# Patient Record
Sex: Female | Born: 1975 | Race: White | Hispanic: No | Marital: Married | Smoking: Current every day smoker
Health system: Southern US, Community
[De-identification: ages and names within clinical notes are randomized; demographics above are authoritative.]

## PROBLEM LIST (undated history)

## (undated) DIAGNOSIS — B1920 Unspecified viral hepatitis C without hepatic coma: Secondary | ICD-10-CM

## (undated) HISTORY — PX: APPENDECTOMY: SHX54

---

## 2014-12-27 ENCOUNTER — Encounter (HOSPITAL_COMMUNITY): Payer: Self-pay

## 2014-12-27 ENCOUNTER — Emergency Department (HOSPITAL_COMMUNITY)
Admission: EM | Admit: 2014-12-27 | Discharge: 2014-12-27 | Disposition: A | Discharge status: Home / Self Care | Payer: BLUE CROSS/BLUE SHIELD | Attending: Emergency Medicine | Admitting: Emergency Medicine

## 2014-12-27 DIAGNOSIS — R109 Unspecified abdominal pain: Secondary | ICD-10-CM | POA: Diagnosis present

## 2014-12-27 DIAGNOSIS — N390 Urinary tract infection, site not specified: Secondary | ICD-10-CM | POA: Insufficient documentation

## 2014-12-27 DIAGNOSIS — Z3202 Encounter for pregnancy test, result negative: Secondary | ICD-10-CM | POA: Insufficient documentation

## 2014-12-27 DIAGNOSIS — Z720 Tobacco use: Secondary | ICD-10-CM | POA: Insufficient documentation

## 2014-12-27 DIAGNOSIS — E669 Obesity, unspecified: Secondary | ICD-10-CM | POA: Diagnosis not present

## 2014-12-27 LAB — COMPREHENSIVE METABOLIC PANEL
ALBUMIN: 3.2 g/dL — AB (ref 3.5–5.2)
ALK PHOS: 67 U/L (ref 39–117)
ALT: 10 U/L (ref 0–35)
AST: 11 U/L (ref 0–37)
Anion gap: 9 (ref 5–15)
BUN: 9 mg/dL (ref 6–23)
CALCIUM: 9 mg/dL (ref 8.4–10.5)
CO2: 23 mmol/L (ref 19–32)
Chloride: 107 mmol/L (ref 96–112)
Creatinine, Ser: 0.76 mg/dL (ref 0.50–1.10)
GFR calc Af Amer: >90 mL/min (ref >90)
GFR calc non Af Amer: >90 mL/min (ref >90)
Glucose, Bld: 98 mg/dL (ref 70–99)
Potassium: 3.9 mmol/L (ref 3.5–5.1)
Sodium: 139 mmol/L (ref 135–145)
TOTAL PROTEIN: 6.6 g/dL (ref 6.0–8.3)
Total Bilirubin: 0.2 mg/dL — ABNORMAL LOW (ref 0.3–1.2)

## 2014-12-27 LAB — URINALYSIS, ROUTINE W REFLEX MICROSCOPIC
BILIRUBIN URINE: NEGATIVE
GLUCOSE, UA: NEGATIVE mg/dL
KETONES UR: NEGATIVE mg/dL
Nitrite: POSITIVE — AB
Protein, ur: NEGATIVE mg/dL
Specific Gravity, Urine: 1.016 (ref 1.005–1.030)
Urobilinogen, UA: 0.2 mg/dL (ref 0.0–1.0)
pH: 6.5 (ref 5.0–8.0)

## 2014-12-27 LAB — CBC WITH DIFFERENTIAL/PLATELET
Basophils Absolute: 0 10*3/uL (ref 0.0–0.1)
Basophils Relative: 0 % (ref 0–1)
EOS ABS: 0.2 10*3/uL (ref 0.0–0.7)
Eosinophils Relative: 2 % (ref 0–5)
HCT: 42.6 % (ref 36.0–46.0)
HEMOGLOBIN: 14.3 g/dL (ref 12.0–15.0)
Lymphocytes Relative: 20 % (ref 12–46)
Lymphs Abs: 2.2 10*3/uL (ref 0.7–4.0)
MCH: 29.9 pg (ref 26.0–34.0)
MCHC: 33.6 g/dL (ref 30.0–36.0)
MCV: 88.9 fL (ref 78.0–100.0)
MONOS PCT: 7 % (ref 3–12)
Monocytes Absolute: 0.7 10*3/uL (ref 0.1–1.0)
NEUTROS PCT: 71 % (ref 43–77)
Neutro Abs: 7.6 10*3/uL (ref 1.7–7.7)
Platelets: 284 10*3/uL (ref 150–400)
RBC: 4.79 MIL/uL (ref 3.87–5.11)
RDW: 13.9 % (ref 11.5–15.5)
WBC: 10.8 10*3/uL — ABNORMAL HIGH (ref 4.0–10.5)

## 2014-12-27 LAB — URINE MICROSCOPIC-ADD ON

## 2014-12-27 LAB — PREGNANCY, URINE: Preg Test, Ur: NEGATIVE

## 2014-12-27 MED ORDER — ONDANSETRON HCL 4 MG/2ML IJ SOLN: 4.0000 mg | Freq: Once | INTRAMUSCULAR | Status: DC

## 2014-12-27 MED ORDER — DEXTROSE 5 % IV SOLN: 1.0000 g | Freq: Once | INTRAVENOUS | Status: DC

## 2014-12-27 MED ORDER — SULFAMETHOXAZOLE-TRIMETHOPRIM 800-160 MG PO TABS: 1.0000 | ORAL_TABLET | Freq: Two times a day (BID) | ORAL | Status: AC

## 2014-12-27 MED ORDER — SODIUM CHLORIDE 0.9 % IV BOLUS (SEPSIS): 1000.0000 mL | Freq: Once | INTRAVENOUS | Status: DC

## 2014-12-27 MED ORDER — TRAMADOL HCL 50 MG PO TABS: 50.0000 mg | ORAL_TABLET | Freq: Four times a day (QID) | ORAL | Status: DC | PRN

## 2014-12-27 MED ORDER — MORPHINE SULFATE 4 MG/ML IJ SOLN: 4.0000 mg | Freq: Once | INTRAMUSCULAR | Status: DC

## 2014-12-27 NOTE — Discharge Instructions (Signed)

## 2014-12-27 NOTE — ED Notes (Signed)
Pt stable, ambulatory, states understanding of discharge instructions 

## 2014-12-27 NOTE — ED Provider Notes (Signed)
CSN: [id number]     Arrival date & time 12/27/14  1234 History   First MD Initiated Contact with Patient 12/27/14 1613     Chief Complaint  Patient presents with  . Abdominal Pain  . Back Pain     (Consider location/radiation/quality/duration/timing/severity/associated sxs/prior Treatment) HPI   39 year old female who presents for evaluation of abdominal pain. Patient reports for the past 4 days she has been expressing pain towards the bladder and also pain to her low back. She described pain as a dull and achy sensation with occasional sharp pain. Endorse fever, MAXIMUM TEMPERATURE 101.3 at home improved with taking ibuprofen. She is been feeling nauseous without vomiting. She does have decrease in appetite. Pain is keeping her up at night. She reported having prior history of UTI but normally does not experiencing dysuria such as urinary frequency, urgency, or burning urination. She denies having any severe headache, chest pain, shortness of breath, productive cough, hematuria, vaginal bleeding, vaginal discharge, or rash. Denies any pain with sexual activities. No prior history of STD. She does not have a primary care provider.  History reviewed. No pertinent past medical history. Past Surgical History  Procedure Laterality Date  . Cesarean section  x2   No family history on file. History  Substance Use Topics  . Smoking status: Current Every Day Smoker    Types: Cigarettes  . Smokeless tobacco: Not on file  . Alcohol Use: Yes     Comment: social   OB History    No data available     Review of Systems  All other systems reviewed and are negative.     Allergies  Review of patient's allergies indicates no known allergies.  Home Medications   Prior to Admission medications   Not on File   BP 160/99 mmHg  Pulse 82  Temp(Src) 98.3 F (36.8 C) (Oral)  Resp 17  SpO2 100% Physical Exam  Constitutional: She appears well-developed and well-nourished. No distress.   Obese Caucasian female. Nontoxic in appearance  HENT:  Head: Atraumatic.  Eyes: Conjunctivae are normal.  Neck: Neck supple.  Cardiovascular: Normal rate and regular rhythm.   Pulmonary/Chest: Effort normal and breath sounds normal. No respiratory distress.  Abdominal: Soft. Bowel sounds are normal. She exhibits no distension. There is tenderness (Mild superpubic tenderness to palpation without guarding or rebound tenderness. No hernia noted. No overlying skin changes noted.).  Genitourinary:  Tenderness to left mid to low back on percussion. No CVA tenderness.  Neurological: She is alert.  Skin: No rash noted.  Psychiatric: She has a normal mood and affect.  Nursing note and vitals reviewed.   ED Course  Procedures (including critical care time)  4:32 PM Patient here with suprapubic abdominal pain back pain with recent fever. She is currently afebrile with stable vital sign. Her urinalysis is consistent with a urinary tract infection. Mild leukocytosis without left shift. Electrolytes otherwise unremarkable. Will give a dose of Rocephin here but I anticipate patient can be discharged with antibiotic and a short course of pain medication as requested.  4:49 PM Patient prefers by mouth antibiotic and stable for discharge. She is able to tolerate by mouth. Return precautions discussed.   Labs Review Labs Reviewed  CBC WITH DIFFERENTIAL/PLATELET - Abnormal; Notable for the following:    WBC 10.8 (*)    All other components within normal limits  COMPREHENSIVE METABOLIC PANEL - Abnormal; Notable for the following:    Albumin 3.2 (*)    Total Bilirubin 0.2 (*)  All other components within normal limits  URINALYSIS, ROUTINE W REFLEX MICROSCOPIC - Abnormal; Notable for the following:    APPearance CLOUDY (*)    Hgb urine dipstick SMALL (*)    Nitrite POSITIVE (*)    Leukocytes, UA LARGE (*)    All other components within normal limits  URINE MICROSCOPIC-ADD ON - Abnormal; Notable  for the following:    Squamous Epithelial / LPF MANY (*)    Bacteria, UA MANY (*)    All other components within normal limits  PREGNANCY, URINE    Imaging Review No results found.   EKG Interpretation None      MDM   Final diagnoses:  UTI (lower urinary tract infection)    BP 162/94 mmHg  Pulse 80  Temp(Src) 98.5 F (36.9 C) (Oral)  Resp 18  Ht  (1.549 m)  Wt 152 lb (68.947 kg)  BMI 28.74 kg/m2  SpO2 96%     Fayrene Helper, PA-C 12/27/14 1649  Benjiman Core, MD 12/30/14 [phone]

## 2014-12-27 NOTE — ED Notes (Signed)
Pt having lower back pain and lower mid abd pain since Sunday off and on with fever and nausea but no vomiting. No vaginal discharge or bleeding.

## 2015-07-21 ENCOUNTER — Encounter (HOSPITAL_COMMUNITY): Payer: Self-pay | Admitting: *Deleted

## 2015-07-21 ENCOUNTER — Emergency Department (HOSPITAL_COMMUNITY): Payer: BLUE CROSS/BLUE SHIELD

## 2015-07-21 ENCOUNTER — Emergency Department (HOSPITAL_COMMUNITY)
Admission: EM | Admit: 2015-07-21 | Discharge: 2015-07-21 | Disposition: A | Discharge status: Home / Self Care | Payer: BLUE CROSS/BLUE SHIELD | Attending: Emergency Medicine | Admitting: Emergency Medicine

## 2015-07-21 DIAGNOSIS — Z3202 Encounter for pregnancy test, result negative: Secondary | ICD-10-CM | POA: Diagnosis not present

## 2015-07-21 DIAGNOSIS — Z79899 Other long term (current) drug therapy: Secondary | ICD-10-CM | POA: Insufficient documentation

## 2015-07-21 DIAGNOSIS — R1011 Right upper quadrant pain: Secondary | ICD-10-CM | POA: Insufficient documentation

## 2015-07-21 DIAGNOSIS — F17210 Nicotine dependence, cigarettes, uncomplicated: Secondary | ICD-10-CM | POA: Insufficient documentation

## 2015-07-21 LAB — COMPREHENSIVE METABOLIC PANEL
ALBUMIN: 4 g/dL (ref 3.5–5.0)
ALT: 18 U/L (ref 14–54)
AST: 20 U/L (ref 15–41)
Alkaline Phosphatase: 64 U/L (ref 38–126)
Anion gap: 9 (ref 5–15)
BUN: 16 mg/dL (ref 6–20)
CHLORIDE: 101 mmol/L (ref 101–111)
CO2: 23 mmol/L (ref 22–32)
Calcium: 10.2 mg/dL (ref 8.9–10.3)
Creatinine, Ser: 0.89 mg/dL (ref 0.44–1.00)
GFR calc Af Amer: >60 mL/min (ref >60)
Glucose, Bld: 98 mg/dL (ref 65–99)
POTASSIUM: 4.3 mmol/L (ref 3.5–5.1)
SODIUM: 133 mmol/L — AB (ref 135–145)
Total Bilirubin: 0.6 mg/dL (ref 0.3–1.2)
Total Protein: 7.2 g/dL (ref 6.5–8.1)

## 2015-07-21 LAB — CBC WITH DIFFERENTIAL/PLATELET
BASOS ABS: 0 10*3/uL (ref 0.0–0.1)
BASOS PCT: 0 %
EOS PCT: 2 %
Eosinophils Absolute: 0.2 10*3/uL (ref 0.0–0.7)
HCT: 46.1 % — ABNORMAL HIGH (ref 36.0–46.0)
Hemoglobin: 15.9 g/dL — ABNORMAL HIGH (ref 12.0–15.0)
Lymphocytes Relative: 24 %
Lymphs Abs: 3 10*3/uL (ref 0.7–4.0)
MCH: 31.5 pg (ref 26.0–34.0)
MCHC: 34.5 g/dL (ref 30.0–36.0)
MCV: 91.5 fL (ref 78.0–100.0)
Monocytes Absolute: 0.6 10*3/uL (ref 0.1–1.0)
Monocytes Relative: 5 %
Neutro Abs: 8.7 10*3/uL — ABNORMAL HIGH (ref 1.7–7.7)
Neutrophils Relative %: 69 %
PLATELETS: 250 10*3/uL (ref 150–400)
RBC: 5.04 MIL/uL (ref 3.87–5.11)
RDW: 13 % (ref 11.5–15.5)
WBC: 12.6 10*3/uL — AB (ref 4.0–10.5)

## 2015-07-21 LAB — URINALYSIS, ROUTINE W REFLEX MICROSCOPIC
Bilirubin Urine: NEGATIVE
GLUCOSE, UA: NEGATIVE mg/dL
Hgb urine dipstick: NEGATIVE
Ketones, ur: NEGATIVE mg/dL
Nitrite: NEGATIVE
PROTEIN: NEGATIVE mg/dL
Specific Gravity, Urine: 1.015 (ref 1.005–1.030)
pH: 5 (ref 5.0–8.0)

## 2015-07-21 LAB — LIPASE, BLOOD: LIPASE: 23 U/L (ref 11–51)

## 2015-07-21 LAB — URINE MICROSCOPIC-ADD ON: Bacteria, UA: NONE SEEN

## 2015-07-21 LAB — PREGNANCY, URINE: PREG TEST UR: NEGATIVE

## 2015-07-21 MED ORDER — METRONIDAZOLE 500 MG PO TABS
2000.0000 mg | ORAL_TABLET | Freq: Once | ORAL | Status: AC
Start: 2015-07-21 — End: 2015-07-21
  Administered 2015-07-21: 2000 mg via ORAL
  Filled 2015-07-21: qty 4

## 2015-07-21 MED ORDER — FENTANYL CITRATE (PF) 100 MCG/2ML IJ SOLN
100.0000 ug | Freq: Once | INTRAMUSCULAR | Status: AC
  Administered 2015-07-21: 100 ug via INTRAVENOUS
  Filled 2015-07-21: qty 2

## 2015-07-21 MED ORDER — METRONIDAZOLE 500 MG PO TABS: 2000.0000 mg | ORAL_TABLET | Freq: Once | ORAL | Status: DC

## 2015-07-21 MED ORDER — ONDANSETRON HCL 4 MG PO TABS: 4.0000 mg | ORAL_TABLET | Freq: Four times a day (QID) | ORAL | Status: DC

## 2015-07-21 MED ORDER — OXYCODONE-ACETAMINOPHEN 5-325 MG PO TABS: 2.0000 | ORAL_TABLET | ORAL | Status: DC | PRN

## 2015-07-21 NOTE — Discharge Instructions (Signed)
Ms. Holly [name],  Nice meeting you! Your ultrasound was negative. Please follow-up with your primary care provider within one week. Return to the emergency department if you develop fevers, cannot keep food down, or do not improve within a few days. Feel better soon!  S. Lane HackerNicole Mariama Saintvil, PA-C  Abdominal Pain, Adult Many things can cause abdominal pain. Usually, abdominal pain is not caused by a disease and will improve without treatment. It can often be observed and treated at home. Your health care provider will do a physical exam and possibly order blood tests and X-rays to help determine the seriousness of your pain. However, in many cases, more time must pass before a clear cause of the pain can be found. Before that point, your health care provider may not know if you need more testing or further treatment. HOME CARE INSTRUCTIONS Monitor your abdominal pain for any changes. The following actions may help to alleviate any discomfort you are experiencing:  Only take over-the-counter or prescription medicines as directed by your health care provider.  Do not take laxatives unless directed to do so by your health care provider.  Try a clear liquid diet (broth, tea, or water) as directed by your health care provider. Slowly move to a bland diet as tolerated. SEEK MEDICAL CARE IF:  You have unexplained abdominal pain.  You have abdominal pain associated with nausea or diarrhea.  You have pain when you urinate or have a bowel movement.  You experience abdominal pain that wakes you in the night.  You have abdominal pain that is worsened or improved by eating food.  You have abdominal pain that is worsened with eating fatty foods.  You have a fever. SEEK IMMEDIATE MEDICAL CARE IF:  Your pain does not go away within 2 hours.  You keep throwing up (vomiting).  Your pain is felt only in portions of the abdomen, such as the right side or the left lower portion of the abdomen.  You pass  bloody or black tarry stools. MAKE SURE YOU:  Understand these instructions.  Will watch your condition.  Will get help right away if you are not doing well or get worse.   This information is not intended to replace advice given to you by your health care provider. Make sure you discuss any questions you have with your health care provider.   Document Released: 05/28/2005 Document Revised: 05/09/2015 Document Reviewed: 04/27/2013 Elsevier Interactive Patient Education [employer]2016 Elsevier Inc.

## 2015-07-21 NOTE — ED Notes (Signed)
Pt reports abd pain, n/v/d. Now has dizziness. No acute distress noted.

## 2015-07-21 NOTE — ED Provider Notes (Signed)
CSN: [id number]     Arrival date & time 07/21/15  1105 History   First MD Initiated Contact with Patient 07/21/15 1116     Chief Complaint  Patient presents with  . Abdominal Pain    HPI   Holly [name] is a 39 y.o. F presenting with a 2 day history of abdominal pain, nausea, vomiting, diarrhea, and one episode of dizziness. She describes her pain as RUQ in location, non-radiating, intermittent, a dull ache. She denies fevers, chills, CP, cough, recent abx use, raw/undercooked foods, ill contacts, alleviation attempts or exacerbating factors, hematochezia, association with eating or bowel movements, vaginal odor/itching/discharge.   History reviewed. No pertinent past medical history. Past Surgical History  Procedure Laterality Date  . Cesarean section  x2   History reviewed. No pertinent family history. Social History  Substance Use Topics  . Smoking status: Current Every Day Smoker    Types: Cigarettes  . Smokeless tobacco: None  . Alcohol Use: Yes     Comment: social   OB History    No data available     Review of Systems  Ten systems are reviewed and are negative for acute change except as noted in the HPI   Allergies  Review of patient's allergies indicates no known allergies.  Home Medications   Prior to Admission medications   Medication Sig Start Date End Date Taking? Authorizing Provider  ibuprofen (ADVIL,MOTRIN) 200 MG tablet Take 800 mg by mouth every 6 (six) hours as needed (pain).   Yes Historical Provider, MD  metroNIDAZOLE (FLAGYL) 500 MG tablet Take 4 tablets (2,000 mg total) by mouth once. 07/21/15   Melton Krebs, PA-C  ondansetron (ZOFRAN) 4 MG tablet Take 1 tablet (4 mg total) by mouth every 6 (six) hours. 07/21/15   Melton Krebs, PA-C  oxyCODONE-acetaminophen (PERCOCET/ROXICET) 5-325 MG tablet Take 2 tablets by mouth every 4 (four) hours as needed for severe pain. 07/21/15   Melton Krebs, PA-C   BP 113/82 mmHg  Pulse  82  Temp(Src) 97.8 F (36.6 C) (Oral)  Resp 22  Ht  (1.549 m)  Wt 81.466 kg  BMI 33.95 kg/m2  SpO2 97%  LMP 07/07/2015 Physical Exam  Constitutional: She appears well-developed and well-nourished. No distress.  HENT:  Head: Normocephalic and atraumatic.  Mouth/Throat: Oropharynx is clear and moist. No oropharyngeal exudate.  Eyes: Conjunctivae are normal. Pupils are equal, round, and reactive to light. Right eye exhibits no discharge. Left eye exhibits no discharge. No scleral icterus.  Neck: No tracheal deviation present.  Cardiovascular: Normal rate, regular rhythm, normal heart sounds and intact distal pulses.  Exam reveals no gallop and no friction rub.   No murmur heard. Pulmonary/Chest: Effort normal and breath sounds normal. No respiratory distress. She has no wheezes. She has no rales. She exhibits no tenderness.  Abdominal: Soft. Bowel sounds are normal. She exhibits no distension and no mass. There is tenderness. There is no rebound and no guarding.  RUQ tenderness. Negative Murphy's sign  Musculoskeletal: She exhibits no edema.  Lymphadenopathy:    She has no cervical adenopathy.  Neurological: She is alert. Coordination normal.  Skin: Skin is warm and dry. No rash noted. She is not diaphoretic. No erythema.  Psychiatric: She has a normal mood and affect. Her behavior is normal.  Nursing note and vitals reviewed.   ED Course  Procedures (including critical care time) Labs Review Labs Reviewed  CBC WITH DIFFERENTIAL/PLATELET - Abnormal; Notable for the following:  WBC 12.6 (*)    Hemoglobin 15.9 (*)    HCT 46.1 (*)    Neutro Abs 8.7 (*)    All other components within normal limits  COMPREHENSIVE METABOLIC PANEL - Abnormal; Notable for the following:    Sodium 133 (*)    All other components within normal limits  URINALYSIS, ROUTINE W REFLEX MICROSCOPIC (NOT AT [facility]) - Abnormal; Notable for the following:    Leukocytes, UA SMALL (*)    All other components  within normal limits  URINE MICROSCOPIC-ADD ON - Abnormal; Notable for the following:    Squamous Epithelial / LPF 0-5 (*)    All other components within normal limits  LIPASE, BLOOD  PREGNANCY, URINE    Imaging Review [place] Abdomen Limited  07/21/2015  CLINICAL DATA:  Patient with right upper quadrant pain. EXAM: US ABDOMEN LIMITED - RIGHT UPPER QUADRANT COMPARISON:  CT abdomen pelvis 04/04/2014 FINDINGS: Gallbladder: No gallstones or wall thickening visualized. No sonographic Murphy sign noted. Common bile duct: Diameter: 4 mm Liver: No focal lesion identified. Within normal limits in parenchymal echogenicity. IMPRESSION: No cholelithiasis or sonographic evidence for acute cholecystitis per Electronically Signed   By: Annia Beltrew  Davis M.D.   On: 07/21/2015 13:53   I have personally reviewed and evaluated these images and lab results as part of my medical decision-making.   EKG Interpretation   Date/Time:  Saturday July 21 2015 11:38:01 EST Ventricular Rate:  82 PR Interval:  138 QRS Duration: 101 QT Interval:  369 QTC Calculation: 431 R Axis:   79 Text Interpretation:  Sinus rhythm Abnormal inferior Q waves Abnormal ekg  Confirmed by BEATON  MD, ROBERT ([id number]) on 07/21/2015 11:47:42 AM      MDM   Final diagnoses:  Right upper quadrant pain   Patient non-toxic appearing and VSS. Cholelithiasis vs gastroenteritis most likely. Acute cholecystitis, cholangitis, acute hepatitis, perforated duodenal ulcer, RLL pneumonia less likely based on patient history and physical exam.  UA demonstrates trichomonas. Non-specific leukocytosis at 12.6. Labs and ultrasound otherwise unremarkable for acute changes suggestive of symptom etiology.   On re-evaluation, patient is feeling better. She is able to tolerate PO.  Discussed results of workup with patient (and partner) and advised against sexual contact until trichomonas is treated by both patient and her partner. Patient may be safely  discharged home. Discussed reasons for return. Patient to follow-up with primary care provider within one week. Patient in understanding and agreement with the plan.   Melton KrebsSamantha Nicole Ahren Pettinger, PA-C 08/02/15 1947  Nelva Nayobert Beaton, MD 08/06/15 [phone]

## 2016-01-03 ENCOUNTER — Emergency Department (HOSPITAL_COMMUNITY): Payer: BLUE CROSS/BLUE SHIELD

## 2016-01-03 ENCOUNTER — Encounter (HOSPITAL_COMMUNITY): Payer: Self-pay | Admitting: *Deleted

## 2016-01-03 ENCOUNTER — Emergency Department (HOSPITAL_COMMUNITY)
Admission: EM | Admit: 2016-01-03 | Discharge: 2016-01-03 | Disposition: A | Discharge status: Home / Self Care | Payer: BLUE CROSS/BLUE SHIELD | Attending: Emergency Medicine | Admitting: Emergency Medicine

## 2016-01-03 DIAGNOSIS — R42 Dizziness and giddiness: Secondary | ICD-10-CM | POA: Diagnosis not present

## 2016-01-03 DIAGNOSIS — N390 Urinary tract infection, site not specified: Secondary | ICD-10-CM | POA: Insufficient documentation

## 2016-01-03 DIAGNOSIS — F17210 Nicotine dependence, cigarettes, uncomplicated: Secondary | ICD-10-CM | POA: Insufficient documentation

## 2016-01-03 DIAGNOSIS — R002 Palpitations: Secondary | ICD-10-CM | POA: Diagnosis not present

## 2016-01-03 DIAGNOSIS — Z3202 Encounter for pregnancy test, result negative: Secondary | ICD-10-CM | POA: Insufficient documentation

## 2016-01-03 DIAGNOSIS — R079 Chest pain, unspecified: Secondary | ICD-10-CM | POA: Diagnosis not present

## 2016-01-03 LAB — CBC
HCT: 43.7 % (ref 36.0–46.0)
Hemoglobin: 15.3 g/dL — ABNORMAL HIGH (ref 12.0–15.0)
MCH: 30.4 pg (ref 26.0–34.0)
MCHC: 35 g/dL (ref 30.0–36.0)
MCV: 86.7 fL (ref 78.0–100.0)
PLATELETS: 261 10*3/uL (ref 150–400)
RBC: 5.04 MIL/uL (ref 3.87–5.11)
RDW: 13.4 % (ref 11.5–15.5)
WBC: 14 10*3/uL — AB (ref 4.0–10.5)

## 2016-01-03 LAB — BASIC METABOLIC PANEL
Anion gap: 15 (ref 5–15)
CALCIUM: 9.6 mg/dL (ref 8.9–10.3)
CO2: 17 mmol/L — ABNORMAL LOW (ref 22–32)
CREATININE: 0.8 mg/dL (ref 0.44–1.00)
Chloride: 104 mmol/L (ref 101–111)
GFR calc Af Amer: >60 mL/min (ref >60)
Glucose, Bld: 121 mg/dL — ABNORMAL HIGH (ref 65–99)
Potassium: 3.3 mmol/L — ABNORMAL LOW (ref 3.5–5.1)
SODIUM: 136 mmol/L (ref 135–145)

## 2016-01-03 LAB — URINALYSIS, ROUTINE W REFLEX MICROSCOPIC
BILIRUBIN URINE: NEGATIVE
GLUCOSE, UA: NEGATIVE mg/dL
Ketones, ur: NEGATIVE mg/dL
Nitrite: POSITIVE — AB
PROTEIN: NEGATIVE mg/dL
SPECIFIC GRAVITY, URINE: 1.007 (ref 1.005–1.030)
pH: 7 (ref 5.0–8.0)

## 2016-01-03 LAB — PREGNANCY, URINE: PREG TEST UR: NEGATIVE

## 2016-01-03 LAB — URINE MICROSCOPIC-ADD ON

## 2016-01-03 LAB — I-STAT TROPONIN, ED: Troponin i, poc: 0 ng/mL (ref 0.00–0.08)

## 2016-01-03 MED ORDER — POTASSIUM CHLORIDE CRYS ER 20 MEQ PO TBCR
40.0000 meq | EXTENDED_RELEASE_TABLET | Freq: Once | ORAL | Status: AC
  Administered 2016-01-03: 40 meq via ORAL
  Filled 2016-01-03: qty 2

## 2016-01-03 MED ORDER — CEPHALEXIN 250 MG PO CAPS
500.0000 mg | ORAL_CAPSULE | Freq: Once | ORAL | Status: AC
  Administered 2016-01-03: 500 mg via ORAL
  Filled 2016-01-03: qty 2

## 2016-01-03 MED ORDER — CEPHALEXIN 500 MG PO CAPS: 500.0000 mg | ORAL_CAPSULE | Freq: Four times a day (QID) | ORAL | Status: DC

## 2016-01-03 MED ORDER — SODIUM CHLORIDE 0.9 % IV BOLUS (SEPSIS)
1000.0000 mL | Freq: Once | INTRAVENOUS | Status: AC
  Administered 2016-01-03: 1000 mL via INTRAVENOUS

## 2016-01-03 MED ORDER — ACETAMINOPHEN 500 MG PO TABS
1000.0000 mg | ORAL_TABLET | Freq: Once | ORAL | Status: AC
  Administered 2016-01-03: 1000 mg via ORAL
  Filled 2016-01-03: qty 2

## 2016-01-03 NOTE — Discharge Instructions (Signed)
Palpitations A palpitation is the feeling that your heartbeat is irregular. It may feel like your heart is fluttering or skipping a beat. It may also feel like your heart is beating faster than normal. This is usually not a serious problem. In some cases, you may need more medical tests. HOME CARE  Avoid:  Caffeine in coffee, tea, soft drinks, diet pills, and energy drinks.  Chocolate.  Alcohol.  Stop smoking if you smoke.  Reduce your stress and anxiety. Try:  A method that measures bodily functions so you can learn to control them (biofeedback).  Yoga.  Meditation.  Physical activity such as swimming, jogging, or walking.  Get plenty of rest and sleep. GET HELP IF:  Your fast or irregular heartbeat continues after 24 hours.  Your palpitations occur more often. GET HELP RIGHT AWAY IF:   You have chest pain.  You feel short of breath.  You have a very bad headache.  You feel dizzy or pass out (faint). MAKE SURE YOU:   Understand these instructions.  Will watch your condition.  Will get help right away if you are not doing well or get worse.   This information is not intended to replace advice given to you by your health care provider. Make sure you discuss any questions you have with your health care provider.   Document Released: 05/27/2008 Document Revised: 09/08/2014 Document Reviewed: 10/17/2011 Elsevier Interactive Patient Education 2016 Elsevier Inc.  Urinary Tract Infection A urinary tract infection (UTI) can occur any place along the urinary tract. The tract includes the kidneys, ureters, bladder, and urethra. A type of germ called bacteria often causes a UTI. UTIs are often helped with antibiotic medicine.  HOME CARE   If given, take antibiotics as told by your doctor. Finish them even if you start to feel better.  Drink enough fluids to keep your pee (urine) clear or pale yellow.  Avoid tea, drinks with caffeine, and bubbly (carbonated)  drinks.  Pee often. Avoid holding your pee in for a long time.  Pee before and after having sex (intercourse).  Wipe from front to back after you poop (bowel movement) if you are a woman. Use each tissue only once. GET HELP RIGHT AWAY IF:   You have back pain.  You have lower belly (abdominal) pain.  You have chills.  You feel sick to your stomach (nauseous).  You throw up (vomit).  Your burning or discomfort with peeing does not go away.  You have a fever.  Your symptoms are not better in 3 days. MAKE SURE YOU:   Understand these instructions.  Will watch your condition.  Will get help right away if you are not doing well or get worse.   This information is not intended to replace advice given to you by your health care provider. Make sure you discuss any questions you have with your health care provider.   Document Released: 02/04/2008 Document Revised: 09/08/2014 Document Reviewed: 03/18/2012 Elsevier Interactive Patient Education [employer]2016 Elsevier Inc.

## 2016-01-03 NOTE — ED Notes (Signed)
Per EMS- Pt c/o central and left sided chest pain, palpitations, SOB and dizziness after she used Molly around 1am. Symptoms began around 2am. Pt also c/o some facial numbness and arm tingling.

## 2016-01-03 NOTE — ED Provider Notes (Signed)
CSN: [id number]     Arrival date & time 01/03/16  4098 History   First MD Initiated Contact with Patient 01/03/16 [phone]     Chief Complaint  Patient presents with  . Chest Pain  . 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  The last one was at 1am.  Pt used "Molly".  Following that around 2 am she started having chest palpitations and dizziness.  She felt like her heart was skipping.  She developed a headache.  She started to feel lightheaded.  Her arms were tingling and her face was feeling numb.  The symptoms have persisted although it is getting better.  Face is not bothering her.  Her fingers tingle a little bit and now her back muscles feel like they are intermittently tightening up. History reviewed. No pertinent past medical history. Past Surgical History  Procedure Laterality Date  . Cesarean section  x2   No family history on file. Social History  Substance Use Topics  . Smoking status: Current Every Day Smoker    Types: Cigarettes  . Smokeless tobacco: None  . Alcohol Use: Yes     Comment: social   OB History    No data available     Review of Systems  Constitutional: Negative for fever.  Neurological: Positive for dizziness. Negative for tremors and facial asymmetry.  All other systems reviewed and are negative.     Allergies  Review of patient's allergies indicates no known allergies.  Home Medications   Prior to Admission medications   Medication Sig Start Date End Date Taking? Authorizing Provider  ibuprofen (ADVIL,MOTRIN) 200 MG tablet Take 800 mg by mouth every 6 (six) hours as needed (pain).   Yes Historical Provider, MD  cephALEXin (KEFLEX) 500 MG capsule Take 1 capsule (500 mg total) by mouth 4 (four) times daily. 01/03/16   Linwood Dibbles, MD   BP 122/71 mmHg  Pulse 81  Temp(Src) 98.2 F (36.8 C) (Oral)  Resp 10  Ht  (1.549 m)  Wt 68.04 kg  BMI 28.36 kg/m2  SpO2 98%  LMP 12/05/2015 Physical Exam   Constitutional: She is oriented to person, place, and time. She appears well-developed and well-nourished. No distress.  HENT:  Head: Normocephalic and atraumatic.  Right Ear: External ear normal.  Left Ear: External ear normal.  Mouth/Throat: Oropharynx is clear and moist.  Facial redness  Eyes: Conjunctivae are normal. Right eye exhibits no discharge. Left eye exhibits no discharge. No scleral icterus.  Neck: Neck supple. No tracheal deviation present.  Cardiovascular: Normal rate, regular rhythm and intact distal pulses.   Pulmonary/Chest: Effort normal and breath sounds normal. No stridor. No respiratory distress. She has no wheezes. She has no rales.  Abdominal: Soft. Bowel sounds are normal. She exhibits no distension. There is no tenderness. There is no rebound and no guarding.  Musculoskeletal: She exhibits no edema or tenderness.  Neurological: She is alert and oriented to person, place, and time. She has normal strength. No cranial nerve deficit (no facial droop, extraocular movements intact, no slurred speech) or sensory deficit. She exhibits normal muscle tone. She displays no seizure activity. Coordination normal.  No pronator drift bilateral upper extrem, able to hold both legs off bed for 5 seconds, sensation intact in all extremities, no visual field cuts, no left or right sided neglect, , no nystagmus noted   Skin: Skin is warm and dry. No rash noted.  Psychiatric: She  has a normal mood and affect.  Nursing note and vitals reviewed.   ED Course  Procedures (including critical care time) Labs Review Labs Reviewed  BASIC METABOLIC PANEL - Abnormal; Notable for the following:    Potassium 3.3 (*)    CO2 17 (*)    Glucose, Bld 121 (*)    BUN <5 (*)    All other components within normal limits  CBC - Abnormal; Notable for the following:    WBC 14.0 (*)    Hemoglobin 15.3 (*)    All other components within normal limits  URINALYSIS, ROUTINE W REFLEX MICROSCOPIC (NOT AT  [facility]) - Abnormal; Notable for the following:    APPearance CLOUDY (*)    Hgb urine dipstick SMALL (*)    Nitrite POSITIVE (*)    Leukocytes, UA LARGE (*)    All other components within normal limits  URINE MICROSCOPIC-ADD ON - Abnormal; Notable for the following:    Squamous Epithelial / LPF 0-5 (*)    Bacteria, UA MANY (*)    All other components within normal limits  PREGNANCY, URINE  I-STAT TROPOININ, ED    Imaging Review Dg Chest 2 View  01/03/2016  CLINICAL DATA:  Chest discomfort, hypertension, current smoker. EXAM: CHEST  2 VIEW COMPARISON:  None in PACs FINDINGS: The lungs are adequately inflated. There is no focal infiltrate. The lung markings are coarse lateral to the left cardiac apex. The heart and pulmonary vascularity are normal. The mediastinum is normal in width. There is no pleural effusion. The bony thorax exhibits no acute abnormality. IMPRESSION: Minimal subsegmental atelectasis or scarring at the left lung base. No evidence of CHF or alveolar pneumonia. Electronically Signed   By: David  Swazil[place] M.D.   On: 01/03/2016 07:49   I have personally reviewed and evaluated these images and lab results as part of my medical decision-making.   EKG Interpretation   Date/Time:  Thursday Jan 03 2016 06:50:30 EDT Ventricular Rate:  81 PR Interval:  131 QRS Duration: 97 QT Interval:  408 QTC Calculation: 474 R Axis:   34 Text Interpretation:  Sinus rhythm No significant change since last  tracing Confirmed by Juvia Aerts  MD-J, Hadley Soileau ([id number]) on 01/03/2016 7:14:12 AM      MDM   Final diagnoses:  Palpitation  UTI (lower urinary tract infection)    While patient was being evaluation for her palpitations she mentioned she was having some urinary discomfort.  UA added on.  Consistent with UTI.  Will dc home on oral keflex.  UCX.  Follow up with PCP  Palpitations related to her drug use.  Monitored in the ED.  No further episodes.  Doubt that there electrolyte abnormalities are  significant.  Tolerating PO.  No other complaints.    Linwood DibblesJon Daylyn Azbill, MD 01/03/16 1141

## 2016-01-05 LAB — URINE CULTURE

## 2016-01-06 ENCOUNTER — Telehealth (HOSPITAL_BASED_OUTPATIENT_CLINIC_OR_DEPARTMENT_OTHER): Payer: Self-pay

## 2016-01-06 NOTE — Telephone Encounter (Signed)
Post ED Visit - Positive Culture Follow-up  Culture report reviewed by antimicrobial stewardship pharmacist:  []  Holly [name], Pharm.D. []  Holly [name], [address] BCPS []  Holly [name], Pharm.D. []  Holly [name], Pharm.D., BCPS []  Holly [name], [address] BCPS, AAHIVP []  Holly [name], Pharm.D., BCPS, AAHIVP [x]  Holly [name], Pharm.D. []  Holly [name], [place]  Positive urine culture Treated with Keflex, organism sensitive to the same and no further patient follow-up is required at this time.  Holly [name], Holly [name] 01/06/2016, 11:31 AM

## 2016-02-25 ENCOUNTER — Inpatient Hospital Stay (HOSPITAL_COMMUNITY)
Admission: AD | Admit: 2016-02-25 | Discharge: 2016-02-27 | DRG: 897 | Disposition: A | Discharge status: Home / Self Care | Payer: BLUE CROSS/BLUE SHIELD | Source: Intra-hospital | Attending: Psychiatry | Admitting: Psychiatry

## 2016-02-25 ENCOUNTER — Encounter (HOSPITAL_COMMUNITY): Payer: Self-pay | Admitting: Emergency Medicine

## 2016-02-25 ENCOUNTER — Emergency Department (HOSPITAL_COMMUNITY)
Admission: EM | Admit: 2016-02-25 | Discharge: 2016-02-25 | Disposition: A | Discharge status: Other Acute Inpatient Hospital | Payer: BLUE CROSS/BLUE SHIELD | Attending: Emergency Medicine | Admitting: Emergency Medicine

## 2016-02-25 ENCOUNTER — Encounter (HOSPITAL_COMMUNITY): Payer: Self-pay

## 2016-02-25 DIAGNOSIS — R45851 Suicidal ideations: Secondary | ICD-10-CM | POA: Diagnosis present

## 2016-02-25 DIAGNOSIS — F329 Major depressive disorder, single episode, unspecified: Secondary | ICD-10-CM | POA: Diagnosis present

## 2016-02-25 DIAGNOSIS — F17210 Nicotine dependence, cigarettes, uncomplicated: Secondary | ICD-10-CM | POA: Diagnosis not present

## 2016-02-25 DIAGNOSIS — F3289 Other specified depressive episodes: Secondary | ICD-10-CM

## 2016-02-25 DIAGNOSIS — F1994 Other psychoactive substance use, unspecified with psychoactive substance-induced mood disorder: Secondary | ICD-10-CM | POA: Diagnosis present

## 2016-02-25 DIAGNOSIS — F1924 Other psychoactive substance dependence with psychoactive substance-induced mood disorder: Secondary | ICD-10-CM

## 2016-02-25 DIAGNOSIS — F19239 Other psychoactive substance dependence with withdrawal, unspecified: Secondary | ICD-10-CM | POA: Clinically undetermined

## 2016-02-25 DIAGNOSIS — F1590 Other stimulant use, unspecified, uncomplicated: Secondary | ICD-10-CM | POA: Diagnosis present

## 2016-02-25 DIAGNOSIS — Z818 Family history of other mental and behavioral disorders: Secondary | ICD-10-CM | POA: Diagnosis not present

## 2016-02-25 DIAGNOSIS — F1420 Cocaine dependence, uncomplicated: Secondary | ICD-10-CM | POA: Clinically undetermined

## 2016-02-25 DIAGNOSIS — F1020 Alcohol dependence, uncomplicated: Secondary | ICD-10-CM | POA: Diagnosis not present

## 2016-02-25 DIAGNOSIS — F1424 Cocaine dependence with cocaine-induced mood disorder: Secondary | ICD-10-CM | POA: Diagnosis present

## 2016-02-25 DIAGNOSIS — F1620 Hallucinogen dependence, uncomplicated: Secondary | ICD-10-CM | POA: Clinically undetermined

## 2016-02-25 DIAGNOSIS — F19939 Other psychoactive substance use, unspecified with withdrawal, unspecified: Secondary | ICD-10-CM | POA: Insufficient documentation

## 2016-02-25 LAB — CBC
HCT: 41.9 % (ref 36.0–46.0)
Hemoglobin: 13.8 g/dL (ref 12.0–15.0)
MCH: 29.6 pg (ref 26.0–34.0)
MCHC: 32.9 g/dL (ref 30.0–36.0)
MCV: 89.7 fL (ref 78.0–100.0)
Platelets: 225 10*3/uL (ref 150–400)
RBC: 4.67 MIL/uL (ref 3.87–5.11)
RDW: 14.8 % (ref 11.5–15.5)
WBC: 9.3 10*3/uL (ref 4.0–10.5)

## 2016-02-25 LAB — ACETAMINOPHEN LEVEL: Acetaminophen (Tylenol), Serum: <10 ug/mL — ABNORMAL LOW (ref 10–30)

## 2016-02-25 LAB — RAPID URINE DRUG SCREEN, HOSP PERFORMED
Amphetamines: NOT DETECTED
Barbiturates: NOT DETECTED
Benzodiazepines: NOT DETECTED
Cocaine: POSITIVE — AB
Opiates: NOT DETECTED
Tetrahydrocannabinol: NOT DETECTED

## 2016-02-25 LAB — ETHANOL: Alcohol, Ethyl (B): <5 mg/dL (ref <5)

## 2016-02-25 LAB — SALICYLATE LEVEL: Salicylate Lvl: 8.7 mg/dL (ref 2.8–30.0)

## 2016-02-25 LAB — COMPREHENSIVE METABOLIC PANEL
ALT: 32 U/L (ref 14–54)
AST: 23 U/L (ref 15–41)
Albumin: 3.3 g/dL — ABNORMAL LOW (ref 3.5–5.0)
Alkaline Phosphatase: 68 U/L (ref 38–126)
Anion gap: 7 (ref 5–15)
BUN: 17 mg/dL (ref 6–20)
CO2: 23 mmol/L (ref 22–32)
Calcium: 8.4 mg/dL — ABNORMAL LOW (ref 8.9–10.3)
Chloride: 109 mmol/L (ref 101–111)
Creatinine, Ser: 0.82 mg/dL (ref 0.44–1.00)
GFR calc Af Amer: >60 mL/min (ref >60)
GFR calc non Af Amer: >60 mL/min (ref >60)
Glucose, Bld: 121 mg/dL — ABNORMAL HIGH (ref 65–99)
Potassium: 3.8 mmol/L (ref 3.5–5.1)
Sodium: 139 mmol/L (ref 135–145)
Total Bilirubin: <0.1 mg/dL — ABNORMAL LOW (ref 0.3–1.2)
Total Protein: 6.6 g/dL (ref 6.5–8.1)

## 2016-02-25 LAB — I-STAT BETA HCG BLOOD, ED (MC, WL, AP ONLY): I-stat hCG, quantitative: <5 m[IU]/mL (ref <5)

## 2016-02-25 MED ORDER — METHOCARBAMOL 500 MG PO TABS: 500.0000 mg | ORAL_TABLET | Freq: Three times a day (TID) | ORAL | Status: DC | PRN

## 2016-02-25 MED ORDER — CLONIDINE HCL 0.1 MG PO TABS: 0.1000 mg | ORAL_TABLET | Freq: Every day | ORAL | Status: DC

## 2016-02-25 MED ORDER — MAGNESIUM HYDROXIDE 400 MG/5ML PO SUSP: 30.0000 mL | Freq: Every day | ORAL | Status: DC | PRN

## 2016-02-25 MED ORDER — IBUPROFEN 200 MG PO TABS: 600.0000 mg | ORAL_TABLET | Freq: Three times a day (TID) | ORAL | Status: DC | PRN

## 2016-02-25 MED ORDER — LORAZEPAM 1 MG PO TABS: 1.0000 mg | ORAL_TABLET | Freq: Three times a day (TID) | ORAL | Status: DC

## 2016-02-25 MED ORDER — LORAZEPAM 1 MG PO TABS: 1.0000 mg | ORAL_TABLET | Freq: Every day | ORAL | Status: DC

## 2016-02-25 MED ORDER — HYDROXYZINE HCL 25 MG PO TABS: 25.0000 mg | ORAL_TABLET | Freq: Four times a day (QID) | ORAL | Status: DC | PRN

## 2016-02-25 MED ORDER — ADULT MULTIVITAMIN W/MINERALS CH
1.0000 | ORAL_TABLET | Freq: Every day | ORAL | Status: DC
  Administered 2016-02-26: 1 via ORAL
  Filled 2016-02-25 (×3): qty 1

## 2016-02-25 MED ORDER — VITAMIN B-1 100 MG PO TABS
100.0000 mg | ORAL_TABLET | Freq: Every day | ORAL | Status: DC
  Administered 2016-02-26: 100 mg via ORAL
  Filled 2016-02-25 (×3): qty 1

## 2016-02-25 MED ORDER — DICYCLOMINE HCL 20 MG PO TABS: 20.0000 mg | ORAL_TABLET | Freq: Four times a day (QID) | ORAL | Status: DC | PRN

## 2016-02-25 MED ORDER — ALUM & MAG HYDROXIDE-SIMETH 200-200-20 MG/5ML PO SUSP: 30.0000 mL | ORAL | Status: DC | PRN

## 2016-02-25 MED ORDER — CLONIDINE HCL 0.1 MG PO TABS: 0.1000 mg | ORAL_TABLET | ORAL | Status: DC

## 2016-02-25 MED ORDER — CLONIDINE HCL 0.1 MG PO TABS
0.1000 mg | ORAL_TABLET | Freq: Four times a day (QID) | ORAL | Status: DC
  Administered 2016-02-25 – 2016-02-26 (×3): 0.1 mg via ORAL
  Filled 2016-02-25 (×11): qty 1

## 2016-02-25 MED ORDER — LORAZEPAM 1 MG PO TABS
1.0000 mg | ORAL_TABLET | Freq: Four times a day (QID) | ORAL | Status: DC
  Administered 2016-02-25 – 2016-02-26 (×3): 1 mg via ORAL
  Filled 2016-02-25 (×3): qty 1

## 2016-02-25 MED ORDER — LORAZEPAM 1 MG PO TABS: 1.0000 mg | ORAL_TABLET | Freq: Two times a day (BID) | ORAL | Status: DC

## 2016-02-25 MED ORDER — ONDANSETRON HCL 4 MG PO TABS: 4.0000 mg | ORAL_TABLET | Freq: Three times a day (TID) | ORAL | Status: DC | PRN

## 2016-02-25 MED ORDER — LORAZEPAM 1 MG PO TABS: 1.0000 mg | ORAL_TABLET | Freq: Four times a day (QID) | ORAL | Status: DC | PRN

## 2016-02-25 MED ORDER — ONDANSETRON 4 MG PO TBDP
4.0000 mg | ORAL_TABLET | Freq: Four times a day (QID) | ORAL | Status: DC | PRN
Start: 2016-02-25 — End: 2016-02-27

## 2016-02-25 MED ORDER — ACETAMINOPHEN 325 MG PO TABS
650.0000 mg | ORAL_TABLET | Freq: Four times a day (QID) | ORAL | Status: DC | PRN
  Administered 2016-02-26: 650 mg via ORAL
  Filled 2016-02-25: qty 2

## 2016-02-25 MED ORDER — NAPROXEN 500 MG PO TABS
500.0000 mg | ORAL_TABLET | Freq: Two times a day (BID) | ORAL | Status: DC | PRN
  Administered 2016-02-25 – 2016-02-26 (×2): 500 mg via ORAL
  Filled 2016-02-25 (×2): qty 1

## 2016-02-25 MED ORDER — LOPERAMIDE HCL 2 MG PO CAPS: 2.0000 mg | ORAL_CAPSULE | ORAL | Status: DC | PRN

## 2016-02-25 MED ORDER — THIAMINE HCL 100 MG/ML IJ SOLN: 100.0000 mg | Freq: Once | INTRAMUSCULAR | Status: DC

## 2016-02-25 MED ORDER — LORAZEPAM 1 MG PO TABS: 1.0000 mg | ORAL_TABLET | Freq: Three times a day (TID) | ORAL | Status: DC | PRN
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
## 2016-02-25 NOTE — Progress Notes (Signed)
Patient presents to Ed with SI with plan.  Patient listed as having BCBS insurance without a pcp.  EDCM spoke to patient at bedside.  Female present with patient at bedside.  Patient confirms she does not have a pcp.  [facility] with list of pcps who accept BCBS insurance within a 25 mile radius of patient's zip code [phone].  This information was placed in patient's belongings bag.  No further EDCm needs at this time.

## 2016-02-25 NOTE — Tx Team (Signed)
Initial Interdisciplinary Treatment Plan   PATIENT STRESSORS: Financial difficulties Marital or family conflict Substance abuse   PATIENT STRENGTHS: Capable of independent living Communication skills Physical Health   PROBLEM LIST: Problem List/Patient Goals Date to be addressed Date deferred Reason deferred Estimated date of resolution  Depression 02/25/16     Suicidal ideation 02/25/16     Substance abuse 02/25/16     Anxiety 02/25/16     "I want to get my life back" 02/25/16     "Be a better Mom to my kids" 02/25/16                        DISCHARGE CRITERIA:  Improved stabilization in mood, thinking, and/or behavior Verbal commitment to aftercare and medication compliance  PRELIMINARY DISCHARGE PLAN: Outpatient therapy Medication  management  PATIENT/FAMIILY INVOLVEMENT: This treatment plan has been presented to and reviewed with the patient, Holly [name].  The patient and family have been given the opportunity to ask questions and make suggestions.  Norm ParcelHeather V Auburn Hert 02/25/2016, 11:50 PM

## 2016-02-25 NOTE — Progress Notes (Signed)
Report received from admitting RN.  Introduced self to pt.  Pt denies SI/HI, denies hallucinations, reports generalized pain of 6/10.  Medication education provided.  Medications administered per order.  PRN medication administered for pain.  Pt denies needs and concerns at this time.  She reports she will inform staff of needs and concerns.  Pt verbally contracts for safety.  Will continue to monitor and assess.

## 2016-02-26 ENCOUNTER — Encounter (HOSPITAL_COMMUNITY): Payer: Self-pay | Admitting: Psychiatry

## 2016-02-26 DIAGNOSIS — F1924 Other psychoactive substance dependence with psychoactive substance-induced mood disorder: Secondary | ICD-10-CM

## 2016-02-26 DIAGNOSIS — F1620 Hallucinogen dependence, uncomplicated: Secondary | ICD-10-CM | POA: Clinically undetermined

## 2016-02-26 DIAGNOSIS — F3289 Other specified depressive episodes: Secondary | ICD-10-CM

## 2016-02-26 DIAGNOSIS — F19239 Other psychoactive substance dependence with withdrawal, unspecified: Secondary | ICD-10-CM | POA: Clinically undetermined

## 2016-02-26 DIAGNOSIS — F1420 Cocaine dependence, uncomplicated: Secondary | ICD-10-CM | POA: Clinically undetermined

## 2016-02-26 DIAGNOSIS — F1590 Other stimulant use, unspecified, uncomplicated: Secondary | ICD-10-CM

## 2016-02-26 DIAGNOSIS — F1994 Other psychoactive substance use, unspecified with psychoactive substance-induced mood disorder: Secondary | ICD-10-CM

## 2016-02-26 MED ORDER — FLUOXETINE HCL 20 MG PO CAPS
20.0000 mg | ORAL_CAPSULE | Freq: Every day | ORAL | Status: DC
  Administered 2016-02-26 – 2016-02-27 (×2): 20 mg via ORAL
  Filled 2016-02-26 (×5): qty 1

## 2016-02-26 MED ORDER — CHLORDIAZEPOXIDE HCL 25 MG PO CAPS: 25.0000 mg | ORAL_CAPSULE | Freq: Four times a day (QID) | ORAL | Status: DC | PRN

## 2016-02-26 MED ORDER — HYDROXYZINE HCL 25 MG PO TABS
25.0000 mg | ORAL_TABLET | ORAL | Status: DC | PRN
  Administered 2016-02-26: 25 mg via ORAL
  Filled 2016-02-26: qty 1

## 2016-02-26 MED ORDER — PNEUMOCOCCAL VAC POLYVALENT 25 MCG/0.5ML IJ INJ: 0.5000 mL | INJECTION | INTRAMUSCULAR | Status: DC

## 2016-02-26 MED ORDER — ENSURE ENLIVE PO LIQD
237.0000 mL | Freq: Two times a day (BID) | ORAL | Status: DC
  Administered 2016-02-26: 237 mL via ORAL

## 2016-02-26 MED ORDER — NICOTINE 21 MG/24HR TD PT24
21.0000 mg | MEDICATED_PATCH | Freq: Every day | TRANSDERMAL | Status: DC
  Administered 2016-02-26 – 2016-02-27 (×2): 21 mg via TRANSDERMAL
  Filled 2016-02-26 (×4): qty 1
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
## 2016-02-26 NOTE — H&P (Signed)
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 Substance induced mood disorder [facility]) [F19.94] 02/25/2016   History of Present Illness: This is an admission assessment for this 40 year old caucasian female. Admitted to the [facility] adult unit with complaints of drug induced suicidal ideations. She is here for drug detox, mood stabilization treatments & a referral to substance abuse treatment program. During this assessment, Holly [name] reports, "My friend took me to the ED yesterday. I was having suicidal thoughts. I'm on drugs, it is getting out of control. I have been using drugs on & off x 1 year. My drug use worsened over the last 4 months. I use mostly Molly, a combination of Ectasy & Meth. I shoot them up. I have suicidal ideation going on 2 months. The suicidal thoughts has worsened in the last couple of weeks. I did not attempt suicide, but I have a plan to either slit my wrists or take a bunch of pills. Drugs help me to not think about life. I started using drugs 18 months ago because I was in a relationship with a guy that used drugs. I have not had any treatments yet. I need help to get my life on track again. I feel very depressed in the last 6 months".  Associated Signs/Symptoms:  Depression Symptoms:  depressed mood, insomnia, feelings of worthlessness/guilt, hopelessness, anxiety,  (Hypo) Manic Symptoms:  Impulsivity, Irritable Mood,  Anxiety Symptoms:  Excessive Worry,  Psychotic Symptoms:  Denies any psychotic psychoric symptoms, denies delusions or  paranoia  PTSD Symptoms: Denies  Total Time spent with patient: 1 hour  Past Psychiatric History: Substance induced mood disorder  Is the patient at risk to self? No.  Has the patient been a risk to self in the past 6 months? No.  Has the patient been a risk to self within the distant past? No.  Is the patient a risk to others? No.  Has the patient been a risk to others in the past 6 months? No.  Has the patient been a risk to others within the distant past? No.   Prior Inpatient Therapy:   Prior Outpatient Therapy:    Alcohol Screening: 1. How often do you have a drink containing alcohol?: 2 to 3 times a week 2. How many drinks containing alcohol do you have on a typical day when you are drinking?: 10 or more 3. How often do you have six or more drinks on one occasion?: Weekly Preliminary Score: 7 4. How often during the last year have you found that you were not able to stop drinking once you had started?: Weekly 5. How often during the last year have you failed to do what was normally expected from you becasue of drinking?: Weekly 6. How often during the last year have you needed a first drink in the morning to get yourself going after a heavy drinking session?: Never 7. How often during the last year have you had a feeling of guilt of remorse after drinking?: Weekly 8. How often during the last year have you been unable to remember what happened the night before because you had been drinking?: Weekly 9. Have you or someone  else been injured as a result of your drinking?: No 10. Has a relative or friend or a doctor or another health worker been concerned about your drinking or suggested you cut down?: Yes, during the last year Alcohol Use Disorder Identification Test Final Score (AUDIT): 26 Brief Intervention: Yes  Substance Abuse History in the last 12 months:  Yes.    Consequences of Substance Abuse: Medical Consequences:  Liver damage, Possible death by overdose Legal  Consequences:  Arrests, jail time, Loss of driving privilege. Family Consequences:  Family discord, divorce and or separation.  Previous Psychotropic Medications: No  Psychological Evaluations: Yes   Past Medical History: History reviewed. No pertinent past medical history.  Past Surgical History  Procedure Laterality Date  . Cesarean section  x2   Family History: History reviewed. No pertinent family history.  Family Psychiatric  History: Drug addiction: Mother  Tobacco Screening: Smokes about a pack daily. Social History:  History  Alcohol Use  . Yes    Comment: social     History  Drug Use No    Comment: Molly    Additional Social History: Pain Medications: See MAR Prescriptions: See MAR Over the Counter: See MAR History of alcohol / drug use?: Yes Negative Consequences of Use: Financial, Legal Withdrawal Symptoms: Agitation Name of Substance 1: "Mollys" 1 - Age of First Use: 40 1 - Amount (size/oz): 0.2 grams  1 - Frequency: every 2-3 hours 1 - Duration: over three months 1 - Last Use / Amount: 02/24/16 Name of Substance 2: Cocaine 2 - Age of First Use: 19 2 - Amount (size/oz): 1 gram 2 - Frequency: pt reports using for last two days  2 - Duration: last two days 2 - Last Use / Amount: 02/24/16 Name of Substance 3: alcohol 3 - Age of First Use: 19 3 - Amount (size/oz): 6-12 (12 oz) 3 - Frequency: 1-2 times per week 3 - Duration: on and off  3 - Last Use / Amount: 02/24/16  Allergies:  No Known Allergies Lab Results:  Results for orders placed or performed during the hospital encounter of 02/25/16 (from the past 48 hour(s))  Rapid urine drug screen (hospital performed)     Status: Abnormal   Collection Time: 02/25/16  4:15 PM  Result Value Ref Range   Opiates NONE DETECTED NONE DETECTED   Cocaine POSITIVE (A) NONE DETECTED   Benzodiazepines NONE DETECTED NONE DETECTED   Amphetamines NONE DETECTED NONE DETECTED   Tetrahydrocannabinol NONE DETECTED NONE  DETECTED   Barbiturates NONE DETECTED NONE DETECTED    Comment:        DRUG SCREEN FOR MEDICAL PURPOSES ONLY.  IF CONFIRMATION IS NEEDED FOR ANY PURPOSE, NOTIFY LAB WITHIN 5 DAYS.        LOWEST DETECTABLE LIMITS FOR URINE DRUG SCREEN Drug Class       Cutoff (ng/mL) Amphetamine      1000 Barbiturate      200 Benzodiazepine   694 Tricyclics       854 Opiates          300 Cocaine          300 THC              50   Comprehensive metabolic panel     Status: Abnormal   Collection Time: 02/25/16  4:37 PM  Result Value Ref Range   Sodium 139 135 - 145 mmol/L   Potassium 3.8 3.5 - 5.1 mmol/L   Chloride 109 101 - 111  mmol/L   CO2 23 22 - 32 mmol/L   Glucose, Bld 121 (H) 65 - 99 mg/dL   BUN 17 6 - 20 mg/dL   Creatinine, Ser 0.82 0.44 - 1.00 mg/dL   Calcium 8.4 (L) 8.9 - 10.3 mg/dL   Total Protein 6.6 6.5 - 8.1 g/dL   Albumin 3.3 (L) 3.5 - 5.0 g/dL   AST 23 15 - 41 U/L   ALT 32 14 - 54 U/L   Alkaline Phosphatase 68 38 - 126 U/L   Total Bilirubin <0.1 (L) 0.3 - 1.2 mg/dL   GFR calc non Af Amer >60 >60 mL/min   GFR calc Af Amer >60 >60 mL/min    Comment: (NOTE) The eGFR has been calculated using the CKD EPI equation. This calculation has not been validated in all clinical situations. eGFR's persistently <60 mL/min signify possible Chronic Kidney Disease.    Anion gap 7 5 - 15  Ethanol     Status: None   Collection Time: 02/25/16  4:37 PM  Result Value Ref Range   Alcohol, Ethyl (B) <5 <5 mg/dL    Comment:        LOWEST DETECTABLE LIMIT FOR SERUM ALCOHOL IS 5 mg/dL FOR MEDICAL PURPOSES ONLY   Salicylate level     Status: None   Collection Time: 02/25/16  4:37 PM  Result Value Ref Range   Salicylate Lvl 8.7 2.8 - 30.0 mg/dL  Acetaminophen level     Status: Abnormal   Collection Time: 02/25/16  4:37 PM  Result Value Ref Range   Acetaminophen (Tylenol), Serum <10 (L) 10 - 30 ug/mL    Comment:        THERAPEUTIC CONCENTRATIONS VARY SIGNIFICANTLY. A RANGE OF 10-30 ug/mL  MAY BE AN EFFECTIVE CONCENTRATION FOR MANY PATIENTS. HOWEVER, SOME ARE BEST TREATED AT CONCENTRATIONS OUTSIDE THIS RANGE. ACETAMINOPHEN CONCENTRATIONS >150 ug/mL AT 4 HOURS AFTER INGESTION AND >50 ug/mL AT 12 HOURS AFTER INGESTION ARE OFTEN ASSOCIATED WITH TOXIC REACTIONS.   cbc     Status: None   Collection Time: 02/25/16  4:37 PM  Result Value Ref Range   WBC 9.3 4.0 - 10.5 K/uL   RBC 4.67 3.87 - 5.11 MIL/uL   Hemoglobin 13.8 12.0 - 15.0 g/dL   HCT 41.9 36.0 - 46.0 %   MCV 89.7 78.0 - 100.0 fL   MCH 29.6 26.0 - 34.0 pg   MCHC 32.9 30.0 - 36.0 g/dL   RDW 14.8 11.5 - 15.5 %   Platelets 225 150 - 400 K/uL  I-Stat beta hCG blood, ED     Status: None   Collection Time: 02/25/16  4:46 PM  Result Value Ref Range   I-stat hCG, quantitative <5.0 <5 mIU/mL   Comment 3            Comment:   GEST. AGE      CONC.  (mIU/mL)   <=1 WEEK        5 - 50     2 WEEKS       50 - 500     3 WEEKS       100 - 10,000     4 WEEKS     1,000 - 30,000        FEMALE AND NON-PREGNANT FEMALE:     LESS THAN 5 mIU/mL     Blood Alcohol level:  Lab Results  Component Value Date   ETH <5 34/19/3790   Metabolic Disorder Labs:  No results found for:  HGBA1C, MPG No results found for: PROLACTIN No results found for: CHOL, TRIG, HDL, CHOLHDL, VLDL, LDLCALC  Current Medications: Current Facility-Administered Medications  Medication Dose Route Frequency Provider Last Rate Last Dose  . acetaminophen (TYLENOL) tablet 650 mg  650 mg Oral Q6H PRN Laverle Hobby, PA-C      . alum & mag hydroxide-simeth (MAALOX/MYLANTA) 200-200-20 MG/5ML suspension 30 mL  30 mL Oral Q4H PRN Laverle Hobby, PA-C      . cloNIDine (CATAPRES) tablet 0.1 mg  0.1 mg Oral QID Laverle Hobby, PA-C   0.1 mg at 02/26/16 0805   Followed by  . [START ON 02/28/2016] cloNIDine (CATAPRES) tablet 0.1 mg  0.1 mg Oral BH-qamhs Spencer E Simon, PA-C       Followed by  . [START ON 03/02/2016] cloNIDine (CATAPRES) tablet 0.1 mg  0.1 mg Oral QAC  breakfast Laverle Hobby, PA-C      . dicyclomine (BENTYL) tablet 20 mg  20 mg Oral Q6H PRN Laverle Hobby, PA-C      . feeding supplement (ENSURE ENLIVE) (ENSURE ENLIVE) liquid 237 mL  237 mL Oral BID BM Myer Peer Cobos, MD      . hydrOXYzine (ATARAX/VISTARIL) tablet 25 mg  25 mg Oral Q6H PRN Laverle Hobby, PA-C      . loperamide (IMODIUM) capsule 2-4 mg  2-4 mg Oral PRN Laverle Hobby, PA-C      . LORazepam (ATIVAN) tablet 1 mg  1 mg Oral Q6H PRN Laverle Hobby, PA-C      . LORazepam (ATIVAN) tablet 1 mg  1 mg Oral QID Laverle Hobby, PA-C   1 mg at 02/26/16 0805   Followed by  . [START ON 02/27/2016] LORazepam (ATIVAN) tablet 1 mg  1 mg Oral TID Laverle Hobby, PA-C       Followed by  . [START ON 02/28/2016] LORazepam (ATIVAN) tablet 1 mg  1 mg Oral BID Laverle Hobby, PA-C       Followed by  . [START ON 03/01/2016] LORazepam (ATIVAN) tablet 1 mg  1 mg Oral Daily Spencer E Simon, PA-C      . magnesium hydroxide (MILK OF MAGNESIA) suspension 30 mL  30 mL Oral Daily PRN Laverle Hobby, PA-C      . methocarbamol (ROBAXIN) tablet 500 mg  500 mg Oral Q8H PRN Laverle Hobby, PA-C      . multivitamin with minerals tablet 1 tablet  1 tablet Oral Daily Laverle Hobby, PA-C   1 tablet at 02/26/16 0805  . naproxen (NAPROSYN) tablet 500 mg  500 mg Oral BID PRN Laverle Hobby, PA-C   500 mg at 02/25/16 2336  . nicotine (NICODERM CQ - dosed in mg/24 hours) patch 21 mg  21 mg Transdermal Daily Jenne Campus, MD   21 mg at 02/26/16 0805  . ondansetron (ZOFRAN-ODT) disintegrating tablet 4 mg  4 mg Oral Q6H PRN Laverle Hobby, PA-C      . [START ON 02/27/2016] pneumococcal 23 valent vaccine (PNU-IMMUNE) injection 0.5 mL  0.5 mL Intramuscular Tomorrow-1000 Fernando A Cobos, MD      . thiamine (B-1) injection 100 mg  100 mg Intramuscular Once Laverle Hobby, PA-C   100 mg at 02/25/16 2300  . thiamine (VITAMIN B-1) tablet 100 mg  100 mg Oral Daily Laverle Hobby, PA-C   100 mg at 02/26/16 6195    PTA Medications: Prescriptions prior to admission  Medication Sig  Dispense Refill Last Dose  . Aspirin-Acetaminophen-Caffeine (GOODY HEADACHE PO) Take 1 packet by mouth 4 (four) times daily as needed (headaches).   02/25/2016 at Unknown time  . cephALEXin (KEFLEX) 500 MG capsule Take 1 capsule (500 mg total) by mouth 4 (four) times daily. (Patient not taking: Reported on 02/25/2016) 28 capsule 0 Completed Course at Unknown time   Musculoskeletal: Strength & Muscle Tone: within normal limits Gait & Station: normal Patient leans: N/A  Psychiatric Specialty Exam: Physical Exam  Constitutional: She is oriented to person, place, and time. She appears well-developed.  HENT:  Head: Normocephalic.  Eyes: Pupils are equal, round, and reactive to light.  Neck: Normal range of motion.  Cardiovascular: Normal rate.   Respiratory: Effort normal.  GI: Soft.  Genitourinary:  Denies any issues in this area  Musculoskeletal: Normal range of motion.  Neurological: She is alert and oriented to person, place, and time.  Skin: Skin is warm and dry.  Psychiatric: Her speech is normal and behavior is normal. Judgment and thought content normal. Her mood appears anxious (Rates #8). Cognition and memory are normal. She exhibits a depressed mood (Rates #8).    Review of Systems  Constitutional: Positive for chills, malaise/fatigue and diaphoresis.  HENT: Negative.   Eyes: Negative.   Respiratory: Negative.   Cardiovascular: Negative.   Gastrointestinal: Positive for nausea.  Genitourinary: Negative.   Musculoskeletal: Negative.   Skin: Negative.   Neurological: Positive for weakness.  Endo/Heme/Allergies: Negative.   Psychiatric/Behavioral: Positive for depression (Rates #8) and substance abuse (Polysubstance dependence). Negative for suicidal ideas, hallucinations and memory loss. The patient is nervous/anxious and has insomnia.     Blood pressure 137/70, pulse 89, temperature 98.2 F (36.8 C),  temperature source Oral, resp. rate 20, height _0  (1.549 m), weight 63.05 kg (139 lb), last menstrual period 02/25/2016, SpO2 99 %.Body mass index is 26.28 kg/(m^2).  General Appearance: Casual  Eye Contact:  Fair  Speech:  Clear and Coherent  Volume:  Normal  Mood:  Anxious and Depressed  Affect:  Flat  Thought Process:  Coherent  Orientation:  Full (Time, Place, and Person)  Thought Content:  Rumination  Suicidal Thoughts:  Denies  Homicidal Thoughts:  Denies  Memory:  Immediate;   Good Recent;   Good Remote;   Good  Judgement:  Fair  Insight:  Fair  Psychomotor Activity:  High anxiety  Concentration:  Concentration: Fair and Attention Span: Fair  Recall:  Good  Fund of Knowledge:  Fair  Language:  Good  Akathisia:  Negative  Handed:  Right  AIMS (if indicated):     Assets:  Desire for Improvement  ADL's:  Intact  Cognition:  WNL  Sleep:  Number of Hours: 6.5   Treatment Plan Summary: Daily contact with patient to assess and evaluate symptoms and progress in treatment and Medication management: 1. Admit for crisis management and stabilization, estimated length of stay 3-5 days.  2. Medication management to reduce current symptoms to base line and improve the patient's overall level of functioning; Discontinue the Clonidine & Ativan detox protocols, initiate Prozac 20 mg for depression, hydroxyzine 25 mg for anxiety, prn & Trazodone 50 mg for insomnia. 3. Treat health problems as indicated.  4. Develop treatment plan to decrease risk of relapse upon discharge and the need for readmission.  5. Psycho-social education regarding relapse prevention and self care.  6. Health care follow up as needed for medical problems.  7. Review, reconcile, and reinstate any pertinent home medications  for other health issues where appropriate. 8. Call for consults with hospitalist for any additional specialty patient care services as needed.  Observation Level/Precautions:  15 minute checks   Laboratory:  Per ED, UDS positive for Cocaine  Psychotherapy: Group sessions   Medications: Discontinue the Clonidine & Ativan detox protocols, initiate Prozac 20 mg for depression, hydroxyzine 25 mg for anxiety, prn & Trazodone 50 mg for insomnia.  Consultations: As needed   Discharge Concerns: Safety, maintaining sobriety/mood stability.  Estimated LOS: 2-4 days  Other: Admit to [id number]     I certify that inpatient services furnished can reasonably be expected to improve the patient's condition.    Encarnacion Slates, NP, PMHNP, FNP-BC 6/27/201710:15 AM

## 2016-02-26 NOTE — Progress Notes (Signed)
Holly [name] rates Anxiety 0/10 and Depression 5/10. Rates HA 9/10 with Goal 0/10. Her goal tonight is "to not want to hurt myself". Denies SI/HI/AVH. Encouragement and support given. Medications administered as prescribed. Continue Q 15 minute checks for patient safety and medication effectiveness.

## 2016-02-26 NOTE — Progress Notes (Signed)
D: Pt presents with flat affect and depressed mood on approach. Pt became tearful on approach and verbalized to writer that she wants to get her life back. Pt stated that she's been abusing drugs and tried to take her life away. Pt denies suicidal thoughts today and verbally contracts for safety. Pt rates depression 10/10. hopelessness 5/10. Denies anxiety. Pt reports withdrawal symptoms of chills, craving, cramps, agitation and irritability. Pt compliant with taking meds and attending groups. No side effects to meds verbalized by pt. Suicide safely plan completed. A: Medications reviewed by Clinical research associatewriter. Verbal support provided. Pt encouraged to attend groups. 15 minute checks performed for safety.  R: Pt stated goal "to be better than I was yesterday". Pt receptive to tx.
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
## 2016-02-26 NOTE — [facility] Group Notes (Signed)
[facility] LCSW Group Therapy  02/26/2016 1:16 PM  Type of Therapy:  Group Therapy  Participation Level:  Active  Participation Quality:  Attentive  Affect:  Appropriate  Cognitive:  Alert and Oriented  Insight:  Improving  Engagement in Therapy:  Improving  Modes of Intervention:  Discussion, Education, Exploration, Problem-solving, Rapport Building, Socialization and Support  Summary of Progress/Problems: MHA Speaker came to talk about his personal journey with substance abuse and addiction. The pt processed ways by which to relate to the speaker. MHA speaker provided handouts and educational information pertaining to groups and services offered by the [facility].  Smart, Salley Boxley LCSW 02/26/2016, 1:16 PM
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
## 2016-02-26 NOTE — Progress Notes (Signed)
NUTRITION ASSESSMENT  Pt identified as at risk on the Malnutrition Screen Tool  INTERVENTION: 1. Supplements: Ensure Enlive po BID, each supplement provides 350 kcal and 20 grams of protein  NUTRITION DIAGNOSIS: Unintentional weight loss related to sub-optimal intake as evidenced by pt report.   Goal: Pt to meet >/= 90% of their estimated nutrition needs.  Monitor:  PO intake  Assessment:  Pt admitted with depression and substance abuse. Pt reports drug use in the past 3 months and ETOH use (6-12 12 oz beers twice weekly). Pt currently homeless.  Per weight history, pt has lost 11 lb since 5/4 (7% wt loss x 2 months, significant for time frame). Suspect poor quality diet PTA d/t substance abuse. Pt has been ordered Ensure supplements, will continue order.  Height: Ht Readings from Last 1 Encounters:  02/25/16 5\' 1"  (1.549 m)    Weight: Wt Readings from Last 1 Encounters:  02/25/16 139 lb (63.05 kg)    Weight Hx: Wt Readings from Last 10 Encounters:  02/25/16 139 lb (63.05 kg)  01/03/16 150 lb (68.04 kg)  07/21/15 179 lb 9.6 oz (81.466 kg)  12/27/14 152 lb (68.947 kg)    BMI:  Body mass index is 26.28 kg/(m^2). Pt meets criteria for overweight based on current BMI.  Estimated Nutritional Needs: Kcal: 25-30 kcal/kg Protein: > 1 gram protein/kg Fluid: 1 ml/kcal  Diet Order: Diet regular Room service appropriate?: Yes; Fluid consistency:: Thin Pt is also offered choice of unit snacks mid-morning and mid-afternoon.  Pt is eating as desired.   Lab results and medications reviewed.   Tilda FrancoLindsey Mayerly Kaman, MS, RD, LDN Pager: [phone] After Hours Pager: [phone]

## 2016-02-26 NOTE — Progress Notes (Signed)
Pt attended AA group this evening.  

## 2016-02-26 NOTE — Progress Notes (Signed)
Recreation Therapy Notes  Animal-Assisted Activity (AAA) Program Checklist/Progress Notes Patient Eligibility Criteria Checklist & Daily Group note for Rec Tx Intervention  Date: 06.27.2017 Time: 2:45pm Location: [address] Dayroom    AAA/T Program Assumption of Risk Form signed by Patient/ or Parent Legal Guardian Yes  Patient is free of allergies or sever asthma Yes  Patient reports no fear of animals Yes  Patient reports no history of cruelty to animals Yes  Patient understands his/her participation is voluntary Yes  Patient washes hands before animal contact Yes  Patient washes hands after animal contact Yes  Behavioral Response: Did not attend.   Asiel Chrostowski L Aparna Vanderweele, LRT/CTRS        Kiele Heavrin L 02/26/2016 3:03 PM 

## 2016-02-27 LAB — URINALYSIS, ROUTINE W REFLEX MICROSCOPIC
Bilirubin Urine: NEGATIVE
GLUCOSE, UA: NEGATIVE mg/dL
KETONES UR: NEGATIVE mg/dL
NITRITE: POSITIVE — AB
PROTEIN: NEGATIVE mg/dL
Specific Gravity, Urine: 1.021 (ref 1.005–1.030)
pH: 5.5 (ref 5.0–8.0)

## 2016-02-27 LAB — LIPID PANEL
CHOL/HDL RATIO: 5.4 ratio
CHOLESTEROL: 150 mg/dL (ref 0–200)
HDL: 28 mg/dL — ABNORMAL LOW (ref >40)
LDL Cholesterol: 77 mg/dL (ref 0–99)
Triglycerides: 227 mg/dL — ABNORMAL HIGH (ref <150)
VLDL: 45 mg/dL — AB (ref 0–40)

## 2016-02-27 LAB — RAPID URINE DRUG SCREEN, HOSP PERFORMED
Amphetamines: NOT DETECTED
BENZODIAZEPINES: POSITIVE — AB
Barbiturates: NOT DETECTED
Cocaine: POSITIVE — AB
Opiates: NOT DETECTED
Tetrahydrocannabinol: NOT DETECTED

## 2016-02-27 LAB — URINE MICROSCOPIC-ADD ON

## 2016-02-27 LAB — PREGNANCY, URINE: PREG TEST UR: NEGATIVE

## 2016-02-27 LAB — TSH: TSH: 3.108 u[IU]/mL (ref 0.350–4.500)

## 2016-02-27 MED ORDER — NICOTINE 21 MG/24HR TD PT24: 21.0000 mg | MEDICATED_PATCH | Freq: Every day | TRANSDERMAL | Status: DC

## 2016-02-27 MED ORDER — FLUOXETINE HCL 20 MG PO CAPS: 20.0000 mg | ORAL_CAPSULE | Freq: Every day | ORAL | Status: DC

## 2016-02-27 MED ORDER — HYDROXYZINE HCL 25 MG PO TABS: 25.0000 mg | ORAL_TABLET | ORAL | Status: DC | PRN

## 2016-02-27 NOTE — Progress Notes (Signed)
  [facility] Discharge [facility] :  Will you be returning to the same living situation after discharge:  Yes,  home until she can be [facility] provided. pt declined referral for inpatient treatment. At discharge, do you have transportation home?: Yes,  friend Do you have the ability to pay for your medications: Yes,  BCBS  Release of information consent forms completed and submitted to medical records by CSW.  Patient to Follow up at: Follow-up Information    Follow up with [facility] On 02/29/2016.   Why:  Appt on this date at 9:45AM. Please bring the following: photo ID, social security card, proof of household income if you have it, and insurance card if you have it. Thank you.    Contact information:   [address] [place], [place] [address] Phone: [phone] Fax: [phone]      Next level of care provider has access to [facility] Link: no.   Safety Planning and Suicide Prevention discussed: Yes,  SPE completed with pt's friend. SPI pamphlet and Mobile Crisis information provided to pt and she was encouraged to share information with support network, ask questions, and talk about any concerns relating to SPE.  Have you used any form of tobacco in the last 30 days? (Cigarettes, Smokeless Tobacco, Cigars, and/or Pipes): Yes  Has patient been referred to the Quitline?: Patient refused referral  Patient has been referred for addiction treatment: Yes  Smart, Roma Bierlein LCSW 02/27/2016, 11:26 AM

## 2016-02-27 NOTE — Progress Notes (Signed)
Discharge note: Patient discharged home per MD order.  Patient received all personal belongings from unit and locker.  Reviewed AVS/transition report.  Reviewed follow up appointments and patient indicated understanding.  She denies SI/HI/AVH.  Patient left ambulatory for the bus station.

## 2016-02-27 NOTE — Discharge Summary (Signed)
Physician Discharge Summary Note  Patient:  Holly [name] is an 40 y.o., female MRN:  [id number] DOB:  04/09/1976 Patient phone:  [phone] (home)  Patient address:   [address]  [place] [id number],  Total Time spent with patient: 30 minutes  Date of Admission:  02/25/2016 Date of Discharge: 02/26/2016  Reason for Admission:  Drug induced suicidal ideation  Principal Problem: Substance or medication-induced depressive disorder with onset during withdrawal [facility](HCC) Discharge Diagnoses: Patient Active Problem List   Diagnosis Date Noted  . Cocaine use disorder, severe, dependence (HCC) [F14.20] 02/26/2016  . 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 Substance or medication-induced depressive disorder with onset during withdrawal [facility](HCC) [F19.94] 02/26/2016    Past Psychiatric History:  See HPI  Past Medical History: History reviewed. No pertinent past medical history.  Past Surgical History  Procedure Laterality Date  . Cesarean section  x2   Family History:  Family History  Problem Relation Age of Onset  . Cancer Mother   . Alcoholism Mother   . Depression Mother    Family Psychiatric  History:  See HPI Social History:  History  Alcohol Use  . Yes    Comment: social     History  Drug Use No    Comment: Molly    Social History   Social History  . Marital Status: Single    Spouse Name: N/A  . Number of Children: N/A  . Years of Education: N/A   Social History Main Topics  . Smoking status: Current Every Day Smoker    Types: Cigarettes  . Smokeless tobacco: None  . Alcohol Use: Yes     Comment: social  . Drug Use: No     Comment: Molly  . Sexual Activity: Not Asked   Other Topics Concern  . None   Social History Narrative    Hospital Course:   Tracey HarriesShannon [name], [facility] with complaints of drug induced suicidal ideations. She is here for drug detox, mood stabilization treatments &  a referral to substance abuse treatment program.  Holly [name] was admitted for Substance or medication-induced depressive disorder with onset during withdrawal [facility](HCC) and crisis management.  She was treated with Prozac 20 mg depression and Vistaril 25 mg anxiety.  Medical problems were identified and treated as needed.  Home medications were restarted as appropriate.  Improvement was monitored by observation and Holly [name] daily report of symptom reduction.  Emotional and mental status was monitored by daily self inventory reports completed by Holly [name] and clinical staff.  Patient reported continued improvement, denied any new concerns.  Patient had been compliant on medications and denied side effects.  Support and encouragement was provided.    At time of discharge, patient rated both depression and anxiety levels to be manageable and minimal.  Patient encouraged to attend groups to help with recognizing triggers of emotional crises and de-stabilizations.  Patient encouraged to attend group to help identify the positive things in life that would help in dealing with feelings of loss, depression and unhealthy or abusive tendencies.         Holly [name] was evaluated by the treatment team for stability and plans for continued recovery upon discharge.  She was offered further treatment options upon discharge including Residential, Intensive Outpatient and Outpatient treatment. She will follow up with agencies listed below for medication management and counseling.  Encouraged patient to maintain satisfactory support network and home environment.  Advised to adhere to medication compliance and outpatient treatment follow up.  Prescriptions provided.       Holly [name] motivation was an integral factor for scheduling further treatment.  Employment, transportation, bed availability, health status, family support, and any pending legal issues were also considered during her  hospital stay.  Upon completion of this admission the patient was both mentally and medically stable for discharge denying suicidal/homicidal ideation, auditory/visual/tactile hallucinations, delusional thoughts and paranoia.      Physical Findings: AIMS: Facial and Oral Movements Muscles of Facial Expression: None, normal Lips and Perioral Area: None, normal Jaw: None, normal Tongue: None, normal,Extremity Movements Upper (arms, wrists, hands, fingers): None, normal Lower (legs, knees, ankles, toes): None, normal, Trunk Movements Neck, shoulders, hips: None, normal, Overall Severity Severity of abnormal movements (highest score from questions above): None, normal Incapacitation due to abnormal movements: None, normal Patient's awareness of abnormal movements (rate only patient's report): No Awareness, Dental Status Current problems with teeth and/or dentures?: No Does patient usually wear dentures?: No  CIWA:  CIWA-Ar Total: 0 COWS:  COWS Total Score: 0  Musculoskeletal: Strength & Muscle Tone: within normal limits Gait & Station: normal Patient leans: N/A  Psychiatric Specialty Exam:  See MD SRA Physical Exam  Nursing note and vitals reviewed.   Review of Systems  All other systems reviewed and are negative.   Blood pressure 130/80, pulse 85, temperature 98.6 F (37 C), temperature source Oral, resp. rate 16, height 5\' 1"  (1.549 m), weight 63.05 kg (139 lb), last menstrual period 02/25/2016, SpO2 99 %.Body mass index is 26.28 kg/(m^2).   Have you used any form of tobacco in the last 30 days? (Cigarettes, Smokeless Tobacco, Cigars, and/or Pipes): Yes  Has this patient used any form of tobacco in the last 30 days? (Cigarettes, Smokeless Tobacco, Cigars, and/or Pipes) Yes, N/A  Blood Alcohol level:  Lab Results  Component Value Date   ETH <5 02/25/2016    Metabolic Disorder Labs:  No results found for: HGBA1C, MPG No results found for: PROLACTIN No results found for:  CHOL, TRIG, HDL, CHOLHDL, VLDL, LDLCALC  See Psychiatric Specialty Exam and Suicide Risk Assessment completed by Attending Physician prior to discharge.  Discharge destination:  Home  Is patient on multiple antipsychotic therapies at discharge:  No   Has Patient had three or more failed trials of antipsychotic monotherapy by history:  No  Recommended Plan for Multiple Antipsychotic Therapies: NA     Medication List    ASK your doctor about these medications      Indication   cephALEXin 500 MG capsule  Commonly known as:  KEFLEX  Take 1 capsule (500 mg total) by mouth 4 (four) times daily.      GOODY HEADACHE PO  Take 1 packet by mouth 4 (four) times daily as needed (headaches).          Follow-up recommendations:  Activity:  as tol Diet:  as tol  Comments:  1.  Take all your medications as prescribed.   2.  Report any adverse side effects to outpatient provider. 3.  Patient instructed to not use alcohol or illegal drugs while on prescription medicines. 4.  In the event of worsening symptoms, instructed patient to call 911, the crisis hotline or go to nearest emergency room for evaluation of symptoms.  Signed: Lindwood QuaSheila May Madalena Kesecker, NP [facility] 02/27/2016, 9:59 AM

## 2016-02-27 NOTE — Progress Notes (Signed)
Recreation Therapy Notes  Date: 06.28.2017 Time: 9:30am Location: [address] Group Room   Group Topic: Stress Management  Goal Area(s) Addresses:  Patient will actively participate in stress management techniques presented during session.   Behavioral Response: Did not attend.   Alexica Schlossberg L Honora Searson, LRT/CTRS        Genavieve Mangiapane L 02/27/2016 10:21 AM 
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
## 2016-02-27 NOTE — [facility] Suicide Risk Assessment (Signed)
[facility] INPATIENT:  Family/Significant Other Suicide Prevention Education  Suicide Prevention Education:  Education Completed; Holly [name] (pt's friend) [phone] has been identified by the patient as the family member/significant other with whom the patient will be residing, and identified as the person(s) who will aid the patient in the event of a mental health crisis (suicidal ideations/suicide attempt).  With written consent from the patient, the family member/significant other has been provided the following suicide prevention education, prior to the and/or following the discharge of the patient.  The suicide prevention education provided includes the following:  Suicide risk factors  Suicide prevention and interventions  National Suicide Hotline telephone number  [facility] assessment telephone number  [facility] Emergency Assistance 911  [facility] and/or Residential Mobile Crisis Unit telephone number  Request made of family/significant other to:  Remove weapons (e.g., guns, rifles, knives), all items previously/currently identified as safety concern.    Remove drugs/medications (over-the-counter, prescriptions, illicit drugs), all items previously/currently identified as a safety concern.  The family member/significant other verbalizes understanding of the suicide prevention education information provided.  The family member/significant other agrees to remove the items of safety concern listed above.  Holly [name], Dewane Timson LCSW 02/27/2016, 11:25 AM

## 2016-02-27 NOTE — [facility] Suicide Risk Assessment (Signed)
[facility]   Principal Problem: Substance or medication-induced depressive disorder with onset during withdrawal [facility](HCC) Discharge Diagnoses:  Patient Active Problem List   Diagnosis Date Noted  . Cocaine use disorder, severe, dependence (HCC) [F14.20] 02/26/2016  . 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 Substance or medication-induced depressive disorder with onset during withdrawal [facility](HCC) [F19.94] 02/26/2016    Total Time spent with patient: 30 minutes  Musculoskeletal: Strength & Muscle Tone: within normal limits Gait & Station: normal Patient leans: N/A  Psychiatric Specialty Exam: Review of Systems  Psychiatric/Behavioral: Positive for substance abuse. Negative for depression.  All other systems reviewed and are negative.   Blood pressure 130/80, pulse 85, temperature 98.6 F (37 C), temperature source Oral, resp. rate 16, height 5\' 1"  (1.549 m), weight 63.05 kg (139 lb), last menstrual period 02/25/2016, SpO2 99 %.Body mass index is 26.28 kg/(m^2).  General Appearance: Casual  Eye Contact::  Fair  Speech:  Clear and Coherent409  Volume:  Normal  Mood:  Euthymic  Affect:  Congruent  Thought Process:  Goal Directed and Descriptions of Associations: Intact  Orientation:  Full (Time, Place, and Person)  Thought Content:  Logical  Suicidal Thoughts:  No  Homicidal Thoughts:  No  Memory:  Immediate;   Fair Recent;   Fair Remote;   Fair  Judgement:  Fair  Insight:  Fair  Psychomotor Activity:  Normal  Concentration:  Fair  Recall:  FiservFair  Fund of Knowledge:Fair  Language: Fair  Akathisia:  No  Handed:  Right  AIMS (if indicated):     Assets:  Communication Skills Desire for Improvement  Sleep:  Number of Hours: 6.5  Cognition: WNL  ADL's:  Intact   Mental Status Per Nursing Assessment::   On Admission:  Suicidal ideation indicated by patient  Demographic Factors:  Caucasian  Loss  Factors: Financial problems/change in socioeconomic status  Historical Factors: Impulsivity  Risk Reduction Factors:   Positive social support  Continued Clinical Symptoms:  Alcohol/Substance Abuse/Dependencies  Cognitive Features That Contribute To Risk:  None    Suicide Risk:  Minimal: No identifiable suicidal ideation.  Patients presenting with no risk factors but with morbid ruminations; may be classified as minimal risk based on the severity of the depressive symptoms    Plan Of Care/Follow-up recommendations:  Activity:  no restrictions Diet:  regular Tests:  as needed Other:  follow up with aftercare  Kamonte Mcmichen, MD 02/27/2016, 10:06 AM
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
## 2016-07-13 ENCOUNTER — Encounter (HOSPITAL_COMMUNITY): Payer: Self-pay | Admitting: Emergency Medicine

## 2016-07-13 ENCOUNTER — Emergency Department (HOSPITAL_COMMUNITY)
Admission: EM | Admit: 2016-07-13 | Discharge: 2016-07-13 | Disposition: A | Discharge status: Home / Self Care | Payer: BLUE CROSS/BLUE SHIELD | Attending: Emergency Medicine | Admitting: Emergency Medicine

## 2016-07-13 ENCOUNTER — Emergency Department (HOSPITAL_COMMUNITY): Payer: BLUE CROSS/BLUE SHIELD

## 2016-07-13 DIAGNOSIS — Y999 Unspecified external cause status: Secondary | ICD-10-CM | POA: Insufficient documentation

## 2016-07-13 DIAGNOSIS — Y9289 Other specified places as the place of occurrence of the external cause: Secondary | ICD-10-CM | POA: Diagnosis not present

## 2016-07-13 DIAGNOSIS — S93402A Sprain of unspecified ligament of left ankle, initial encounter: Secondary | ICD-10-CM | POA: Insufficient documentation

## 2016-07-13 DIAGNOSIS — Y939 Activity, unspecified: Secondary | ICD-10-CM | POA: Diagnosis not present

## 2016-07-13 DIAGNOSIS — W010XXA Fall on same level from slipping, tripping and stumbling without subsequent striking against object, initial encounter: Secondary | ICD-10-CM | POA: Insufficient documentation

## 2016-07-13 DIAGNOSIS — F17210 Nicotine dependence, cigarettes, uncomplicated: Secondary | ICD-10-CM | POA: Insufficient documentation

## 2016-07-13 DIAGNOSIS — S92155A Nondisplaced avulsion fracture (chip fracture) of left talus, initial encounter for closed fracture: Secondary | ICD-10-CM | POA: Diagnosis not present

## 2016-07-13 DIAGNOSIS — S99922A Unspecified injury of left foot, initial encounter: Secondary | ICD-10-CM | POA: Diagnosis present

## 2016-07-13 MED ORDER — HYDROCODONE-ACETAMINOPHEN 5-325 MG PO TABS: 1.0000 | ORAL_TABLET | Freq: Four times a day (QID) | ORAL | 0 refills | Status: DC | PRN

## 2016-07-13 MED ORDER — OXYCODONE-ACETAMINOPHEN 5-325 MG PO TABS
1.0000 | ORAL_TABLET | Freq: Once | ORAL | Status: AC
  Administered 2016-07-13: 1 via ORAL
  Filled 2016-07-13: qty 1

## 2016-07-13 NOTE — ED Triage Notes (Signed)
Pt states she tripped and fell last night.  C/o pain and ecchymosis to L foot.

## 2016-07-13 NOTE — ED Provider Notes (Signed)
[facility]-EMERGENCY DEPT Provider Note   CSN: [id number] Arrival date & time: 07/13/16  2106    By signing my name below, I, Holly [name], attest that this documentation has been prepared under the direction and in the presence of [facility] Neese. Electronically Signed: Clarisse GougeXavier [name], Scribe. 07/14/16. 12:54 AM.   History   Chief Complaint Chief Complaint  Patient presents with  . Foot Pain   The history is provided by the patient. No language interpreter was used.   HPI Comments: Holly [name] is a 40 y.o. female who presents to the Emergency Department complaining of sudden onset, constant, gradual worsening foot left pain x 2 days. Pt states that she injured it last night when she tripped and fell down her porch steps. Pt reports associated swelling, cold sensation in affected area and ecchymosis. She reports attempting to treat with elevation and ibuprofen with minimal relief. She further notes that she used ice at onset of injury last night, but she has not today because of the cold sensation already present in the area.   History reviewed. No pertinent past medical history.  Patient Active Problem List   Diagnosis Date Noted  . Cocaine use disorder, severe, dependence (HCC) 02/26/2016  . Methylenedioxymethyamphetamine (MDMA) use disorder, moderate (HCC) 02/26/2016  . 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 APPENDECTOMY    . CESAREAN SECTION  x2    OB History    No data available       Home Medications    Prior to Admission medications   Medication Sig Start Date End Date Taking? Authorizing Provider  FLUoxetine (PROZAC) 20 MG capsule Take 1 capsule (20 mg total) by mouth daily. 02/27/16   Adonis BrookSheila Agustin, NP  HYDROcodone-acetaminophen (NORCO) 5-325 MG tablet Take 1 tablet by mouth every 6 (six) hours as needed. 07/13/16   Hope Orlene OchM Neese, NP  hydrOXYzine  (ATARAX/VISTARIL) 25 MG tablet Take 1 tablet (25 mg total) by mouth every 4 (four) hours as needed for anxiety (Sleep). 02/27/16   Adonis BrookSheila Agustin, NP  nicotine (NICODERM CQ - DOSED IN MG/24 HOURS) 21 mg/24hr patch Place 1 patch (21 mg total) onto the skin daily. 02/27/16   Adonis BrookSheila Agustin, NP    Family History Family History  Problem Relation Age of Onset  . Cancer Mother   . Alcoholism Mother   . Depression Mother     Social History Social History  Substance Use Topics  . Smoking status: Current Every Day Smoker    Types: Cigarettes  . Smokeless tobacco: Never Used  . Alcohol use Yes     Comment: social     Allergies   Patient has no known allergies.   Review of Systems Review of Systems  Musculoskeletal: Positive for arthralgias, gait problem, joint swelling and myalgias.  All other systems reviewed and are negative.    Physical Exam Updated Vital Signs BP 180/93 (BP Location: Right Arm)   Pulse 70   Temp 98.1 F (36.7 C) (Oral)   Resp 18   Ht 5\' 1"  (1.549 m)   Wt 63.5 kg   LMP 06/29/2016   SpO2 97%   BMI 26.45 kg/m   Physical Exam  Constitutional: She is oriented to person, place, and time. She appears well-developed and well-nourished. No distress.  HENT:  Head: Normocephalic and atraumatic.  Eyes: EOM are normal.  Neck: Normal range of motion.  Cardiovascular: Normal rate.  Pulses:      Dorsalis pedis pulses are 2+ on the right side, and 2+ on the left side.  Pulmonary/Chest: Effort normal.  Abdominal: Soft.  Musculoskeletal:       Left foot: There is tenderness, bony tenderness and swelling. There is normal capillary refill, no crepitus, no deformity and no laceration. Decreased range of motion: due to pain.  Swelling and ecchymosis to the dorsum of the left foot and lateral aspect of foot which extends to ankle. No defect or tenderness to the achilles. Pedal pulses 2+, adequate circulation    Neurological: She is alert and oriented to person, place,  and time.  Skin: Skin is warm and dry.  Psychiatric: She has a normal mood and affect. Judgment normal.  Nursing note and vitals reviewed.    ED Treatments / Results  Labs (all labs ordered are listed, but only abnormal results are displayed) Labs Reviewed - No data to display   Radiology Dg Foot Complete Left  Result Date: 07/13/2016 CLINICAL DATA:  Fall last night.  Pain EXAM: LEFT FOOT - COMPLETE 3+ VIEW COMPARISON:  None. FINDINGS: Small avulsion fracture of the distal anterior talus. No other fracture. Calcaneal spurring. IMPRESSION: Avulsion fracture of the anterior talus. Electronically Signed   By: Marlan Palauharles  Clark M.D.   On: 07/13/2016 21:59    Procedures Procedures (including critical care time)  Medications Ordered in ED Medications  oxyCODONE-acetaminophen (PERCOCET/ROXICET) 5-325 MG per tablet 1 tablet (1 tablet Oral Given 07/13/16 2207)     Initial Impression / Assessment and Plan / ED Course  I have reviewed the triage vital signs and the nursing notes.  Pertinent  imaging results that were available during my care of the patient were reviewed by me and considered in my medical decision making (see chart for details).  Clinical Course   40 y.o. female with left foot pain s/p injury stable for d/c without focal neuro deficits. Cam walker, elevation and f/u with ortho. Pain management. Discussed with the patient and all questioned fully answered. She will return here if any problems arise.   Final Clinical Impressions(s) / ED Diagnoses   Final diagnoses:  Closed nondisplaced avulsion fracture of left talus, initial encounter  Sprain of left ankle, unspecified ligament, initial encounter    New Prescriptions Discharge Medication List as of 07/13/2016 10:23 PM    START taking these medications   Details  HYDROcodone-acetaminophen (NORCO) 5-325 MG tablet Take 1 tablet by mouth every 6 (six) hours as needed., Starting Sun 07/13/2016, Print      I personally  performed the services described in this documentation, which was scribed in my presence. The recorded information has been reviewed and is accurate.    [facility] Orlene OchM Neese, NP 07/14/16 0100    Donnetta HutchingBrian Cook, MD 07/18/16 [phone]

## 2016-07-13 NOTE — Progress Notes (Signed)
Orthopedic Tech Progress Note Patient Details:  Holly GerlachShannon M [name] 11/27/1975 [id number]  Ortho Devices Type of Ortho Device: CAM walker Ortho Device/Splint Location: LLE Ortho Device/Splint Interventions: Ordered, Application   Holly [name], Israa Caban Craig 07/13/2016, 10:30 PM

## 2016-07-13 NOTE — ED Notes (Signed)
EDP at bedside  

## 2016-07-13 NOTE — Discharge Instructions (Signed)
Continue to take the ibuprofen, do not drive while taking the narcotic as it will make you sleepy.

## 2016-08-03 ENCOUNTER — Emergency Department
Admission: EM | Admit: 2016-08-03 | Discharge: 2016-08-05 | Disposition: A | Discharge status: Other Acute Inpatient Hospital | Payer: BLUE CROSS/BLUE SHIELD | Attending: Emergency Medicine | Admitting: Emergency Medicine

## 2016-08-03 ENCOUNTER — Emergency Department: Payer: BLUE CROSS/BLUE SHIELD

## 2016-08-03 DIAGNOSIS — F1994 Other psychoactive substance use, unspecified with psychoactive substance-induced mood disorder: Secondary | ICD-10-CM | POA: Diagnosis present

## 2016-08-03 DIAGNOSIS — F1924 Other psychoactive substance dependence with psychoactive substance-induced mood disorder: Secondary | ICD-10-CM

## 2016-08-03 DIAGNOSIS — Y929 Unspecified place or not applicable: Secondary | ICD-10-CM | POA: Insufficient documentation

## 2016-08-03 DIAGNOSIS — T7421XA Adult sexual abuse, confirmed, initial encounter: Secondary | ICD-10-CM | POA: Insufficient documentation

## 2016-08-03 DIAGNOSIS — X58XXXA Exposure to other specified factors, initial encounter: Secondary | ICD-10-CM | POA: Insufficient documentation

## 2016-08-03 DIAGNOSIS — F3289 Other specified depressive episodes: Secondary | ICD-10-CM

## 2016-08-03 DIAGNOSIS — Y999 Unspecified external cause status: Secondary | ICD-10-CM | POA: Diagnosis not present

## 2016-08-03 DIAGNOSIS — F17210 Nicotine dependence, cigarettes, uncomplicated: Secondary | ICD-10-CM | POA: Insufficient documentation

## 2016-08-03 DIAGNOSIS — S0083XA Contusion of other part of head, initial encounter: Secondary | ICD-10-CM | POA: Diagnosis not present

## 2016-08-03 DIAGNOSIS — F19239 Other psychoactive substance dependence with withdrawal, unspecified: Secondary | ICD-10-CM | POA: Diagnosis present

## 2016-08-03 DIAGNOSIS — Y939 Activity, unspecified: Secondary | ICD-10-CM | POA: Insufficient documentation

## 2016-08-03 DIAGNOSIS — F1910 Other psychoactive substance abuse, uncomplicated: Secondary | ICD-10-CM | POA: Diagnosis not present

## 2016-08-03 DIAGNOSIS — F1420 Cocaine dependence, uncomplicated: Secondary | ICD-10-CM | POA: Diagnosis present

## 2016-08-03 DIAGNOSIS — Z5181 Encounter for therapeutic drug level monitoring: Secondary | ICD-10-CM | POA: Insufficient documentation

## 2016-08-03 DIAGNOSIS — F1620 Hallucinogen dependence, uncomplicated: Secondary | ICD-10-CM | POA: Diagnosis present

## 2016-08-03 LAB — URINE DRUG SCREEN, QUALITATIVE (ARMC ONLY)
Amphetamines, Ur Screen: NOT DETECTED
BARBITURATES, UR SCREEN: NOT DETECTED
BENZODIAZEPINE, UR SCRN: NOT DETECTED
CANNABINOID 50 NG, UR ~~LOC~~: NOT DETECTED
Cocaine Metabolite,Ur ~~LOC~~: NOT DETECTED
MDMA (Ecstasy)Ur Screen: NOT DETECTED
METHADONE SCREEN, URINE: NOT DETECTED
OPIATE, UR SCREEN: POSITIVE — AB
PHENCYCLIDINE (PCP) UR S: NOT DETECTED
Tricyclic, Ur Screen: NOT DETECTED

## 2016-08-03 LAB — COMPREHENSIVE METABOLIC PANEL
ALT: 58 U/L — AB (ref 14–54)
AST: 49 U/L — AB (ref 15–41)
Albumin: 4.2 g/dL (ref 3.5–5.0)
Alkaline Phosphatase: 91 U/L (ref 38–126)
Anion gap: 13 (ref 5–15)
BUN: 15 mg/dL (ref 6–20)
CO2: 20 mmol/L — ABNORMAL LOW (ref 22–32)
CREATININE: 1.08 mg/dL — AB (ref 0.44–1.00)
Calcium: 9.3 mg/dL (ref 8.9–10.3)
Chloride: 104 mmol/L (ref 101–111)
GFR calc Af Amer: >60 mL/min (ref >60)
Glucose, Bld: 128 mg/dL — ABNORMAL HIGH (ref 65–99)
Potassium: 4 mmol/L (ref 3.5–5.1)
Sodium: 137 mmol/L (ref 135–145)
Total Bilirubin: 1.2 mg/dL (ref 0.3–1.2)
Total Protein: 8.1 g/dL (ref 6.5–8.1)

## 2016-08-03 LAB — CBC
HEMATOCRIT: 44.9 % (ref 35.0–47.0)
Hemoglobin: 15.7 g/dL (ref 12.0–16.0)
MCH: 30.2 pg (ref 26.0–34.0)
MCHC: 35 g/dL (ref 32.0–36.0)
MCV: 86.1 fL (ref 80.0–100.0)
Platelets: 308 10*3/uL (ref 150–440)
RBC: 5.22 MIL/uL — ABNORMAL HIGH (ref 3.80–5.20)
RDW: 14.5 % (ref 11.5–14.5)
WBC: 18.9 10*3/uL — ABNORMAL HIGH (ref 3.6–11.0)

## 2016-08-03 LAB — ETHANOL

## 2016-08-03 MED ORDER — SODIUM CHLORIDE 0.9 % IV BOLUS (SEPSIS)
1000.0000 mL | Freq: Once | INTRAVENOUS | Status: AC
  Administered 2016-08-03: 1000 mL via INTRAVENOUS

## 2016-08-03 NOTE — ED Notes (Signed)
Spoke with Trish [employer] manager[employer](SANE nurse) who stated she is currently in [place] but is going to come and speak with patient. Patient updated on wait time, states she is willing to wait.
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
## 2016-08-03 NOTE — ED Notes (Signed)
Pt making multiple comments to SANE RN and SW that she is feeling more depressed and suicidal after assessment. Pt endorses vague SI without plan or intent, c/o hopelessness.  States she wants inpatient psychiatric care.
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
## 2016-08-03 NOTE — ED Notes (Signed)
SANE nurse present to speak with patient.
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
## 2016-08-03 NOTE — ED Notes (Signed)
SANE nurse at bedside.
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
## 2016-08-03 NOTE — ED Notes (Signed)
[place] PD officer Vear Clock(Phillips) at pt bedside at this time
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
## 2016-08-03 NOTE — ED Notes (Signed)
Dr. Lenard LancePaduchowski at pt bedside at this time
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
## 2016-08-03 NOTE — Progress Notes (Signed)
LCSW consulted with ED charge, SANE Nurse and ED RN, patients discharge will now be put on hold as she stated she feels hopeless and wishes they killed her last month. In the care meeting all parties felt it would be best if she remain in ED and re-assessed by TTS/SOC and possible admitted to BMU.   LCSW explained situation to patient and she agreed to [facility] in [facility] and is OK with BMU treatment.  Called TTS and gave them the heads up for this patient- Spoke to HuntleyGreg who will await a consult note.   Delta Air LinesClaudine Tiea Manninen LCSW [phone]

## 2016-08-04 ENCOUNTER — Emergency Department: Payer: BLUE CROSS/BLUE SHIELD

## 2016-08-04 MED ORDER — LORAZEPAM 1 MG PO TABS
1.0000 mg | ORAL_TABLET | Freq: Once | ORAL | Status: AC
  Administered 2016-08-04: 1 mg via ORAL
  Filled 2016-08-04: qty 1

## 2016-08-04 MED ORDER — IBUPROFEN 600 MG PO TABS
600.0000 mg | ORAL_TABLET | Freq: Four times a day (QID) | ORAL | Status: DC | PRN
  Administered 2016-08-04: 600 mg via ORAL
  Filled 2016-08-04: qty 1

## 2016-08-04 NOTE — ED Notes (Signed)
Patient noted in room. No complaints, stable, in no acute distress. Q15 minute rounds and monitoring via Security Cameras to continue.  

## 2016-08-04 NOTE — ED Notes (Signed)
Patient is sleeping, no signs of distress, reported to oncoming nurse.

## 2016-08-04 NOTE — ED Notes (Signed)
Patient has order for ativan 1 mg, Patient is figity and restless, picking at cover and clothing, nurse ask if she was restless due to coming down from the drugs and she states " yes I always get this way after using Moly" Nurse will continue to monitor.

## 2016-08-04 NOTE — ED Notes (Signed)
Patient is eating dinner, she is alert and oriented, nurse talked to her about how she feels, states " I just don't care if I live or die" patient also talked about not having any support to even try to live better, she has sad, despairing affect, Patient states that she feels hopeless, but she does want to know how to live stable without drugs. Patient is safe, q 15 minute checks and camera surveillance in progress.
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
## 2016-08-04 NOTE — ED Provider Notes (Signed)
-----------------------------------------   8:11 AM on 08/04/2016 -----------------------------------------   Blood pressure 135/83, pulse 76, temperature 98.4 F (36.9 C), temperature source Oral, resp. rate 18, height 5\' 1"  (1.549 m), weight 140 lb (63.5 kg), last menstrual period 07/23/2016, SpO2 98 %.  The patient had no acute events since last update.  Calm and cooperative at this time.  Disposition is pending Psychiatry/Behavioral Medicine team recommendations.     Irean HongJade J Alfard Cochrane, MD 08/04/16 [phone]

## 2016-08-04 NOTE — ED Notes (Signed)
Patient talking with Dr. Clapacs 
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
## 2016-08-04 NOTE — Consult Note (Signed)
Psychiatry: Consult for this 40 year old woman with substance abuse. Patient was seen this morning that she was unable to give any coherent history. Very sedated. Almost delirious. Nursing tells me the patient has been in and out since coming in and clearly intoxicated. Medically he appears to be stabilized. We will reevaluate later. Not clear that she will need hospitalization. Doesn't appear to be suicidal so much as with acute substance abuse.

## 2016-08-05 ENCOUNTER — Encounter (HOSPITAL_COMMUNITY): Payer: Self-pay

## 2016-08-05 ENCOUNTER — Observation Stay (HOSPITAL_COMMUNITY)
Admission: AD | Admit: 2016-08-05 | Discharge: 2016-08-06 | Disposition: A | Discharge status: Rehabilitation Facility | Payer: BLUE CROSS/BLUE SHIELD | Source: Intra-hospital | Attending: Psychiatry | Admitting: Psychiatry

## 2016-08-05 DIAGNOSIS — F329 Major depressive disorder, single episode, unspecified: Secondary | ICD-10-CM | POA: Insufficient documentation

## 2016-08-05 DIAGNOSIS — F17210 Nicotine dependence, cigarettes, uncomplicated: Secondary | ICD-10-CM | POA: Insufficient documentation

## 2016-08-05 DIAGNOSIS — F1910 Other psychoactive substance abuse, uncomplicated: Principal | ICD-10-CM | POA: Insufficient documentation

## 2016-08-05 DIAGNOSIS — F1994 Other psychoactive substance use, unspecified with psychoactive substance-induced mood disorder: Secondary | ICD-10-CM | POA: Diagnosis not present

## 2016-08-05 DIAGNOSIS — Z590 Homelessness: Secondary | ICD-10-CM | POA: Diagnosis not present

## 2016-08-05 LAB — URINALYSIS, ROUTINE W REFLEX MICROSCOPIC
Bilirubin Urine: NEGATIVE
GLUCOSE, UA: NEGATIVE mg/dL
Ketones, ur: NEGATIVE mg/dL
NITRITE: POSITIVE — AB
PH: 6 (ref 5.0–8.0)
Protein, ur: 30 mg/dL — AB
SPECIFIC GRAVITY, URINE: 1.018 (ref 1.005–1.030)

## 2016-08-05 MED ORDER — ACETAMINOPHEN 325 MG PO TABS
650.0000 mg | ORAL_TABLET | Freq: Four times a day (QID) | ORAL | Status: DC | PRN
  Administered 2016-08-05: 650 mg via ORAL
  Filled 2016-08-05: qty 2

## 2016-08-05 MED ORDER — HYDROXYZINE HCL 25 MG PO TABS
25.0000 mg | ORAL_TABLET | Freq: Four times a day (QID) | ORAL | Status: DC | PRN
  Administered 2016-08-05: 25 mg via ORAL
  Filled 2016-08-05: qty 1

## 2016-08-05 MED ORDER — SULFAMETHOXAZOLE-TRIMETHOPRIM 800-160 MG PO TABS
1.0000 | ORAL_TABLET | Freq: Two times a day (BID) | ORAL | Status: DC
  Administered 2016-08-05: 1 via ORAL
  Filled 2016-08-05: qty 1

## 2016-08-05 MED ORDER — TRAZODONE HCL 50 MG PO TABS
50.0000 mg | ORAL_TABLET | Freq: Every evening | ORAL | Status: DC | PRN
  Administered 2016-08-05: 50 mg via ORAL
  Filled 2016-08-05: qty 1

## 2016-08-05 MED ORDER — MAGNESIUM HYDROXIDE 400 MG/5ML PO SUSP: 30.0000 mL | Freq: Every day | ORAL | Status: DC | PRN

## 2016-08-05 MED ORDER — ALUM & MAG HYDROXIDE-SIMETH 200-200-20 MG/5ML PO SUSP: 30.0000 mL | ORAL | Status: DC | PRN

## 2016-08-05 MED ORDER — SULFAMETHOXAZOLE-TRIMETHOPRIM 800-160 MG PO TABS
1.0000 | ORAL_TABLET | Freq: Two times a day (BID) | ORAL | Status: DC
  Administered 2016-08-05 – 2016-08-06 (×2): 1 via ORAL
  Filled 2016-08-05 (×2): qty 1

## 2016-08-05 NOTE — ED Notes (Signed)
Patient noted in room. No complaints, stable, in no acute distress. Q15 minute rounds and monitoring via Security Cameras to continue.  
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
## 2016-08-05 NOTE — ED Notes (Signed)
Encouraged fluids

## 2016-08-05 NOTE — Progress Notes (Signed)
Patient would like outpatient treatment program information. Staff provided patient with information to [facility], [facility]. All of these facilities are now closed. Patient plans to call in the am to make appointment with whoever can get her in the quickest.

## 2016-08-05 NOTE — Progress Notes (Signed)
D: Patient seen lying in bed awake. Reports pain on the left hand which this writer observed swollen (+). Patient could not give a vivid info about the swollen hand. Endorses depression 6/10. Denies active SI/HI, AH/VH. No behavioral issues noted. A: Staff offered support and encouragement as needed. Offered due/prn meds as ordered. Constant observation for safety maintained except when patient is in the bathroom. Will continue to monitor patient for safety and stability.  R: Patient remains safe

## 2016-08-05 NOTE — BH Assessment (Signed)
08/05/16 1145 Spoke with Steffanie RainwaterLindsey, AC at [facility] regarding potential admission to obs bed, per Dr Toni Amendlapacs recommendation.  Mardella LaymanLindsey reviewed pt and had concerns regarding high white blood cell count.  Once this is addressed, Mardella LaymanLindsey said pt can be accepted for OBS bed.  Discussed pt with Dr York CeriseForbach in the ED who said there are a number of reasons for the high white count but he would order UA on this pt. Daleen SquibbGreg Shainna Faux, LCSW

## 2016-08-05 NOTE — ED Provider Notes (Addendum)
-----------------------------------------   11:57 AM on 08/05/2016 -----------------------------------------  Tammy SoursGreg from TTS called.  They are attempting to move the patient over to Obs in [place], but one of the nurses in [place] reviewed the patient's record and was concerned about her leukocytosis of 18.  He called to ask if this is been addressed.  I reviewed the initial ED provider note and pointed out that the patient has a number of reasons for leukocytosis that includes cocaine use, drug use in general, stress reaction, trauma, etc.  She has been in the ED for 2 days and at no point has had a fever and she has had no tachycardia since her initial triage vitals.  She had no signs or symptoms of any acute infection.  The nurse in [place] pointed out that there was no urinalysis I ordered a urinalysis but explained that she is considered medically cleared and the leukocytosis should not be the determining factor on whether or not she gets the behavioral medicine care that she needs.  Tammy SoursGreg said that he understood and we would proceed with urinalysis.   Loleta Roseory Shantae Vantol, MD 08/05/16 1158    ----------------------------------------- 2:03 PM on 08/05/2016 -----------------------------------------  [facility] ([facility] accepting).   Loleta Roseory Jerold Yoss, MD 08/05/16 [phone]

## 2016-08-05 NOTE — ED Provider Notes (Signed)
-----------------------------------------   8:19 AM on 08/05/2016 -----------------------------------------   Blood pressure (!) 144/69, pulse 66, temperature 98.5 F (36.9 C), temperature source Oral, resp. rate 20, height 5\' 1"  (1.549 m), weight 140 lb (63.5 kg), last menstrual period 07/23/2016, SpO2 98 %.  The patient had no acute events since last update.  Calm and cooperative at this time.  Disposition is pending Psychiatry/Behavioral Medicine team recommendations.     Rebecka ApleyAllison P Webster, MD 08/05/16 [phone]

## 2016-08-06 DIAGNOSIS — Z9889 Other specified postprocedural states: Secondary | ICD-10-CM | POA: Diagnosis not present

## 2016-08-06 DIAGNOSIS — Z818 Family history of other mental and behavioral disorders: Secondary | ICD-10-CM | POA: Diagnosis not present

## 2016-08-06 DIAGNOSIS — F1910 Other psychoactive substance abuse, uncomplicated: Secondary | ICD-10-CM | POA: Diagnosis not present

## 2016-08-06 DIAGNOSIS — Z808 Family history of malignant neoplasm of other organs or systems: Secondary | ICD-10-CM

## 2016-08-06 DIAGNOSIS — Z79899 Other long term (current) drug therapy: Secondary | ICD-10-CM

## 2016-08-06 DIAGNOSIS — Z811 Family history of alcohol abuse and dependence: Secondary | ICD-10-CM

## 2016-08-06 MED ORDER — NICOTINE 21 MG/24HR TD PT24: 21.0000 mg | MEDICATED_PATCH | Freq: Every day | TRANSDERMAL | 0 refills | Status: AC

## 2016-08-06 MED ORDER — SULFAMETHOXAZOLE-TRIMETHOPRIM 800-160 MG PO TABS: 1.0000 | ORAL_TABLET | Freq: Two times a day (BID) | ORAL | 0 refills | Status: AC

## 2016-08-06 MED ORDER — TRAZODONE HCL 50 MG PO TABS: 50.0000 mg | ORAL_TABLET | Freq: Every evening | ORAL | 0 refills | Status: AC | PRN

## 2016-08-06 MED ORDER — HYDROXYZINE HCL 25 MG PO TABS: 25.0000 mg | ORAL_TABLET | Freq: Four times a day (QID) | ORAL | 0 refills | Status: AC | PRN
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
## 2016-08-06 NOTE — Progress Notes (Signed)
Pt discharged from facility ambulatory and alert. Denies SI/HI/AVH and contracted for safety prior to discharge. AVS, RX and personal belongings in hand. Arrangements made for pt to get on a plane to [facility] in [place] Pt anxious but optimistic about plans. Emotional comfort and support given to pt by staff.

## 2016-08-06 NOTE — Progress Notes (Signed)
Patient was reminded to go over the list of placements she had been given the believling before. This reporter did review the list with her to help her see the ones that would accept her insurance.  Patient had wanted to wash her clothes, however this wasn't allowed in [employer].  This reported did go to the Adult unit and found her some clothing that might fit her so she would have some clean clothing to wear when she left to go to a Rehab program.  Patient did call a couple of Facilities and was waiting to hear back from them.

## 2016-08-07 DIAGNOSIS — Z9889 Other specified postprocedural states: Secondary | ICD-10-CM | POA: Diagnosis not present

## 2016-08-07 DIAGNOSIS — Z818 Family history of other mental and behavioral disorders: Secondary | ICD-10-CM | POA: Diagnosis not present

## 2016-08-07 DIAGNOSIS — Z811 Family history of alcohol abuse and dependence: Secondary | ICD-10-CM | POA: Diagnosis not present

## 2016-08-07 DIAGNOSIS — F1910 Other psychoactive substance abuse, uncomplicated: Secondary | ICD-10-CM | POA: Diagnosis not present

## 2017-04-03 ENCOUNTER — Encounter (HOSPITAL_COMMUNITY): Payer: Self-pay | Admitting: Emergency Medicine

## 2017-04-03 ENCOUNTER — Ambulatory Visit (HOSPITAL_COMMUNITY)
Admission: EM | Admit: 2017-04-03 | Discharge: 2017-04-03 | Disposition: A | Discharge status: Home / Self Care | Payer: BLUE CROSS/BLUE SHIELD

## 2017-04-03 DIAGNOSIS — L03113 Cellulitis of right upper limb: Secondary | ICD-10-CM

## 2017-04-03 DIAGNOSIS — L0291 Cutaneous abscess, unspecified: Secondary | ICD-10-CM

## 2017-04-03 HISTORY — DX: Unspecified viral hepatitis C without hepatic coma: B19.20

## 2017-04-03 MED ORDER — CEPHALEXIN 500 MG PO CAPS: 500.0000 mg | ORAL_CAPSULE | Freq: Four times a day (QID) | ORAL | 0 refills | Status: DC

## 2017-04-03 MED ORDER — LIDOCAINE HCL 2 % IJ SOLN
INTRAMUSCULAR | Status: AC
  Filled 2017-04-03: qty 20

## 2017-04-03 MED ORDER — LIDOCAINE HCL (PF) 1 % IJ SOLN
5.0000 mL | Freq: Once | INTRAMUSCULAR | Status: DC
Start: 2017-04-03 — End: 2017-04-03

## 2017-04-03 MED ORDER — LIDOCAINE HCL 2 % IJ SOLN
5.0000 mL | Freq: Once | INTRAMUSCULAR | Status: AC
  Administered 2017-04-03: 100 mg
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
## 2017-04-03 NOTE — Discharge Instructions (Addendum)
You will need to return for follow up in 24 / 48 hours  Continued to evacuate the infection wash and dry area and cover

## 2017-04-03 NOTE — ED Provider Notes (Addendum)
CSN: [id number]     Arrival date & time 04/03/17  1506 History   First MD Initiated Contact with Patient 04/03/17 1633     Chief Complaint  Patient presents with  . Abscess   (Consider location/radiation/quality/duration/timing/severity/associated sxs/prior Treatment) Pt states that she was injecting the RT ac with a needle last fri and has noticed an abscess forming. She has had this in the past and had left over keflex and took them for 3 days. Has full ROM to arm and fingers.       Past Medical History:  Diagnosis Date  . Hepatitis C    Past Surgical History:  Procedure Laterality Date  . APPENDECTOMY    . CESAREAN SECTION  x2   Family History  Problem Relation Age of Onset  . Cancer Mother   . Alcoholism Mother   . Depression Mother    Social History  Substance Use Topics  . Smoking status: Current Every Day Smoker    Packs/day: 1.50    Types: Cigarettes  . Smokeless tobacco: Never Used     Comment: Patient refused cessation materials  . Alcohol use No     Comment: Sts she drinks more than 6 drinks daily   OB History    No data available     Review of Systems  Constitutional: Negative.   Respiratory: Negative.   Cardiovascular: Negative.   Gastrointestinal: Negative.   Skin: Positive for wound.       Quarter size abscess to RT anterior portion of ac. Erythema surrounding area   Neurological: Negative.     Allergies  Patient has no known allergies.  Home Medications   Prior to Admission medications   Medication Sig Start Date End Date Taking? Authorizing Provider  buPROPion (WELLBUTRIN SR) 150 MG 12 hr tablet Take 150 mg by mouth 2 (two) times daily.   Yes [provider]  busPIRone (BUSPAR) 30 MG tablet Take 30 mg by mouth daily.   Yes [provider]  prazosin (MINIPRESS) 2 MG capsule Take 4 mg by mouth at bedtime.   Yes [provider]  cephALEXin (KEFLEX) 500 MG capsule Take 1 capsule (500 mg total) by mouth 4 (four)  times daily. 04/03/17   Tobi BastosMitchell, Mace Weinberg A, NP  hydrOXYzine (ATARAX/VISTARIL) 25 MG tablet Take 1 tablet (25 mg total) by mouth every 6 (six) hours as needed for anxiety. 08/06/16   Laveda AbbeParks, Laurie Britton, NP  nicotine (NICODERM CQ - DOSED IN MG/24 HOURS) 21 mg/24hr patch Place 1 patch (21 mg total) onto the skin daily. 08/06/16   Laveda AbbeParks, Laurie Britton, NP  traZODone (DESYREL) 50 MG tablet Take 1 tablet (50 mg total) by mouth at bedtime as needed for sleep. 08/06/16   Laveda AbbeParks, Laurie Britton, NP   Meds Ordered and Administered this Visit   Medications  lidocaine (XYLOCAINE) 2 % (with pres) injection 100 mg (100 mg Infiltration Given 04/03/17 1700)    BP (!) 150/91   Pulse 70   Temp 99.4 F (37.4 C) (Oral)   Resp 16   Ht 5\' 1"  (1.549 m)   Wt 145 lb (65.8 kg)   LMP 03/31/2017   SpO2 99%   BMI 27.40 kg/m  No data found.   Physical Exam  Constitutional: She appears well-developed.  Cardiovascular: Normal rate and regular rhythm.   Pulmonary/Chest: Effort normal and breath sounds normal.  Abdominal: Soft. Bowel sounds are normal.  Musculoskeletal: Normal range of motion.  Neurological: She is alert.  Skin: Capillary refill takes  less than 2 seconds. Rash noted. There is erythema.  Quarter size abscess to RT anterior portion of ac. Erythema surrounding area     Urgent Care Course     .Marland Kitchen.Incision and Drainage Date/Time: 04/03/2017 7:04 PM Performed by: Maple MirzaMITCHELL, Mickelle Goupil A Authorized by: Maple MirzaMITCHELL, Briceyda Abdullah A   Consent:    Consent obtained:  Verbal   Consent given by:  Patient   Risks discussed:  Incomplete drainage, pain, bleeding, infection and damage to other organs   Alternatives discussed:  Delayed treatment Location:    Type:  Abscess   Location:  Upper extremity   Upper extremity location:  Arm   Arm location:  R upper arm Pre-procedure details:    Skin preparation:  Betadine Anesthesia (see MAR for exact dosages):    Anesthesia method:  Local infiltration   Local  anesthetic:  Lidocaine 2% w/o epi Procedure type:    Complexity:  Complex Procedure details:    Needle aspiration: no     Incision types:  Single straight   Incision depth:  Dermal   Scalpel blade:  10   Wound management:  Irrigated with saline   Drainage:  Serosanguinous   Drainage amount:  Moderate   Wound treatment:  Wound left open   Packing materials:  None (pt not able to tolerate ) Post-procedure details:    Patient tolerance of procedure:  Tolerated with difficulty   (including critical care time)  Labs Review Labs Reviewed - No data to display  Imaging Review No results found.           MDM   1. Abscess   2. Cellulitis of right upper extremity    You will need to return for follow up in 24 / 48 hours  Continued to evacuate the infection wash and dry area and cover  Discussed that area was to tender to allow me to place packing, pt states that she will continue to evacuate the area.  Take tylenol and motrin as needed. Pt verbalized that it felt better prior to leaving     Tobi BastosMitchell, Davene Jobin A, NP 04/03/17 1724    Tobi BastosMitchell, Rechelle Niebla A, NP 04/03/17 Windell [id number]

## 2017-05-20 ENCOUNTER — Ambulatory Visit (HOSPITAL_COMMUNITY)
Admission: EM | Admit: 2017-05-20 | Discharge: 2017-05-20 | Disposition: A | Discharge status: Home / Self Care | Payer: BLUE CROSS/BLUE SHIELD | Attending: Emergency Medicine | Admitting: Emergency Medicine

## 2017-05-20 ENCOUNTER — Encounter (HOSPITAL_COMMUNITY): Payer: Self-pay | Admitting: *Deleted

## 2017-05-20 DIAGNOSIS — R102 Pelvic and perineal pain: Secondary | ICD-10-CM | POA: Diagnosis not present

## 2017-05-20 DIAGNOSIS — Z818 Family history of other mental and behavioral disorders: Secondary | ICD-10-CM | POA: Diagnosis not present

## 2017-05-20 DIAGNOSIS — J302 Other seasonal allergic rhinitis: Secondary | ICD-10-CM | POA: Insufficient documentation

## 2017-05-20 DIAGNOSIS — Z3202 Encounter for pregnancy test, result negative: Secondary | ICD-10-CM | POA: Diagnosis not present

## 2017-05-20 DIAGNOSIS — R350 Frequency of micturition: Secondary | ICD-10-CM | POA: Insufficient documentation

## 2017-05-20 DIAGNOSIS — R05 Cough: Secondary | ICD-10-CM | POA: Insufficient documentation

## 2017-05-20 DIAGNOSIS — Z9889 Other specified postprocedural states: Secondary | ICD-10-CM | POA: Insufficient documentation

## 2017-05-20 DIAGNOSIS — R319 Hematuria, unspecified: Secondary | ICD-10-CM | POA: Diagnosis not present

## 2017-05-20 DIAGNOSIS — N390 Urinary tract infection, site not specified: Secondary | ICD-10-CM | POA: Insufficient documentation

## 2017-05-20 DIAGNOSIS — N12 Tubulo-interstitial nephritis, not specified as acute or chronic: Secondary | ICD-10-CM | POA: Insufficient documentation

## 2017-05-20 DIAGNOSIS — Z809 Family history of malignant neoplasm, unspecified: Secondary | ICD-10-CM | POA: Diagnosis not present

## 2017-05-20 DIAGNOSIS — Z811 Family history of alcohol abuse and dependence: Secondary | ICD-10-CM | POA: Insufficient documentation

## 2017-05-20 DIAGNOSIS — J019 Acute sinusitis, unspecified: Secondary | ICD-10-CM | POA: Diagnosis not present

## 2017-05-20 DIAGNOSIS — F17210 Nicotine dependence, cigarettes, uncomplicated: Secondary | ICD-10-CM | POA: Insufficient documentation

## 2017-05-20 DIAGNOSIS — J309 Allergic rhinitis, unspecified: Secondary | ICD-10-CM

## 2017-05-20 DIAGNOSIS — M545 Low back pain: Secondary | ICD-10-CM | POA: Insufficient documentation

## 2017-05-20 DIAGNOSIS — Z79899 Other long term (current) drug therapy: Secondary | ICD-10-CM | POA: Insufficient documentation

## 2017-05-20 LAB — POCT PREGNANCY, URINE: Preg Test, Ur: NEGATIVE

## 2017-05-20 MED ORDER — CEFTRIAXONE SODIUM 1 G IJ SOLR
INTRAMUSCULAR | Status: AC
  Filled 2017-05-20: qty 10

## 2017-05-20 MED ORDER — PHENAZOPYRIDINE HCL 200 MG PO TABS: 200.0000 mg | ORAL_TABLET | Freq: Three times a day (TID) | ORAL | 0 refills | Status: AC | PRN

## 2017-05-20 MED ORDER — CEFTRIAXONE SODIUM 1 G IJ SOLR
1.0000 g | Freq: Once | INTRAMUSCULAR | Status: AC
  Administered 2017-05-20: 1 g via INTRAMUSCULAR

## 2017-05-20 MED ORDER — NITROFURANTOIN MONOHYD MACRO 100 MG PO CAPS: 100.0000 mg | ORAL_CAPSULE | Freq: Two times a day (BID) | ORAL | 0 refills | Status: AC

## 2017-05-20 MED ORDER — FLUTICASONE PROPIONATE 50 MCG/ACT NA SUSP
2.0000 | Freq: Every day | NASAL | 0 refills | Status: AC
Start: 2017-05-20 — End: ?

## 2017-05-20 NOTE — ED Triage Notes (Signed)
Pt  Reports    Cloudy         Urine    With   Foul  Odor          With       Back  Pain  As   Well      Pt    Reports  A  Cough  For  About  3   Weeks      Pt  Sitting  Upright  On the  Exam table  Speaking  In   Complete   sentances

## 2017-05-20 NOTE — Discharge Instructions (Signed)
Take the medication as written.  Stop the Mucinex and tried Allegra-D, Claritin-D or Zyrtec-D. Use the Flonase as directed. Use a neti pot or the NeilMed sinus rinse as often as you want to to reduce nasal congestion. Follow the directions on the box. If you do not get better than we can start you on  antibiotics at that time.  Go to www.goodrx.com to look up your medications. This will give you a list of where you can find your prescriptions at the most affordable prices. Or you can ask the pharmacist what the cash price is. This is frequently cheaper than going through insurance.    Below is a list of primary care practices who are taking new patients for you to follow-up with. [facility] [address] [place], [place] [address] [phone]  [facility] [phone] [address] [place] [address]  [facility]: [address] [place] [address]  [phone]  [facility]: [address] [place] [address]   [phone]  [facility]: [address] [place] [address]  [phone]  [facility] : [address] [place] [address] [phone]  [facility]: [address] [place] [address] [phone]  [facility]: [address] [place] [address] [phone]  Dr. Oneal Grout [address] [facility] [place] [address]  [phone]  Dr. Jackie Plum, [facility]. [address] [place], [place] [address]  [phone]  Go to www.goodrx.com to look up your medications. This will give you a list of where you can find your prescriptions at the most affordable prices. Or ask the pharmacist what the cash price is, or  if they have any other discount programs available to help make your medication more affordable. This can be less expensive than what you would pay with insurance.

## 2017-05-20 NOTE — ED Provider Notes (Signed)
HPI  SUBJECTIVE:  Holly [name] is a 41 y.o. female who presents with 2 complaints.  First she reports cloudy, odorous urine and left sided low back pain for the past several days. She reports body aches, urinary frequency. She had some mild pelvic pain last night but this has since resolved. No dysuria, hematuria, abdominal pain. No fevers. No vaginal bleeding, discharge, odor, vaginal itching. She is in a long-term monogamous relationship with a female who is asymptomatic, STDs are not a concern today. She states that this is a identical to previous UTIs. No antipyretic in the past 6-8 hours. She tried increasing fluids without improvement in her symptoms. No aggravating factors.  Second, she reports clear nasal congestion, itchy, watery eyes, sneezing, allergy type symptoms per the past 3 weeks. She states that she had some sinus pain and pressure but this is since resolved. She reports a cough productive of the same materials or nasal congestion. She denies rhinorrhea, upper dental pain, facial swelling, fevers. No wheezing, chest pain, shortness of breath. She tried Mucinex or Robitussin with some improvement in her symptoms. No aggravating factors. She states that overall she is getting better, but just hasn't completely cleared this. She states that she is no longer using illicit drugs. LMP: 9/15 she is status post bilateral tubal ligation. PMD: None.    Past Medical History:  Diagnosis Date  . Hepatitis C     Past Surgical History:  Procedure Laterality Date  . APPENDECTOMY    . CESAREAN SECTION  x2    Family History  Problem Relation Age of Onset  . Cancer Mother   . Alcoholism Mother   . Depression Mother     Social History  Substance Use Topics  . Smoking status: Current Every Day Smoker    Packs/day: 1.50    Types: Cigarettes  . Smokeless tobacco: Never Used     Comment: Patient refused cessation materials  . Alcohol use No     Comment: Sts she drinks more than 6  drinks daily     Current Facility-Administered Medications:  .  cefTRIAXone (ROCEPHIN) injection 1 g, 1 g, Intramuscular, Once, Domenick Gong, MD  Current Outpatient Prescriptions:  .  buPROPion (WELLBUTRIN SR) 150 MG 12 hr tablet, Take 150 mg by mouth 2 (two) times daily., Disp: , Rfl:  .  busPIRone (BUSPAR) 30 MG tablet, Take 30 mg by mouth daily., Disp: , Rfl:  .  fluticasone (FLONASE) 50 MCG/ACT nasal spray, Place 2 sprays into both nostrils daily., Disp: 16 g, Rfl: 0 .  hydrOXYzine (ATARAX/VISTARIL) 25 MG tablet, Take 1 tablet (25 mg total) by mouth every 6 (six) hours as needed for anxiety., Disp: 30 tablet, Rfl: 0 .  nicotine (NICODERM CQ - DOSED IN MG/24 HOURS) 21 mg/24hr patch, Place 1 patch (21 mg total) onto the skin daily., Disp: 28 patch, Rfl: 0 .  nitrofurantoin, macrocrystal-monohydrate, (MACROBID) 100 MG capsule, Take 1 capsule (100 mg total) by mouth 2 (two) times daily. X 5 days, Disp: 10 capsule, Rfl: 0 .  phenazopyridine (PYRIDIUM) 200 MG tablet, Take 1 tablet (200 mg total) by mouth 3 (three) times daily as needed for pain., Disp: 6 tablet, Rfl: 0 .  prazosin (MINIPRESS) 2 MG capsule, Take 4 mg by mouth at bedtime., Disp: , Rfl:  .  traZODone (DESYREL) 50 MG tablet, Take 1 tablet (50 mg total) by mouth at bedtime as needed for sleep., Disp: 14 tablet, Rfl: 0  No Known Allergies   ROS  As noted in HPI.   Physical Exam  BP 103/67 (BP Location: Left Arm)   Pulse 77   Temp 98.2 F (36.8 C) (Oral)   Resp 16   LMP 04/15/2017   SpO2 98%   Constitutional: Well developed, well nourished, no acute distress Eyes:  EOMI, conjunctiva normal bilaterally HENT: Normocephalic, atraumatic,mucus membranes moist. No nasal congestion. Erythematous but not significantly swollen turbinates. No sinus tenderness. Positive postnasal drip. Respiratory: Normal inspiratory effort lungs clear bilaterally Cardiovascular: Normal rate GI: nondistended soft, positive left flank  tenderness. Active bowel sounds. No rebound, guarding Back: Positive left CVAT skin: No rash, skin intact Musculoskeletal: no deformities Neurologic: Alert & oriented x 3, no focal neuro deficits Psychiatric: Speech and behavior appropriate   ED Course   Medications  cefTRIAXone (ROCEPHIN) injection 1 g (not administered)    Orders Placed This Encounter  Procedures  . Urine culture    Standing Status:   Standing    Number of Occurrences:   1    Order Specific Question:   List patient's active antibiotics    Answer:   macrobid    Order Specific Question:   Patient immune status    Answer:   Normal  . Pregnancy, urine POC    Standing Status:   Standing    Number of Occurrences:   1    Results for orders placed or performed during the hospital encounter of 05/20/17 (from the past 24 hour(s))  Pregnancy, urine POC     Status: None   Collection Time: 05/20/17 10:39 AM  Result Value Ref Range   Preg Test, Ur NEGATIVE NEGATIVE   No results found.  ED Clinical Impression  Urinary tract infection with hematuria, site unspecified  Allergic rhinitis, unspecified seasonality, unspecified trigger  ED Assessment/Plan  1. UTI/pyelonephritis. UA not crossing over. Patient has large leuks, nitrites, hematuria, trace protein. Urine pregnancy negative  Giving 1 g of Rocephin IM here. Ordering urine culture to confirm antibiotic choice. We will need to text patient  or email her [email or web address] if we need to change her antibiotics, states that she cannot receive phone calls.  Urine culture from 12/2015 showed Escherichia coli UTI that was pansensitive.  2. Sinusitis, allergic rhinitis. She has no evidence of a bacterial sinus infection the absence of sinus tenderness or purulent nasal drainage. She said overall she is getting better from this. It sounds like it is mostly from allergies so will switch from Mucinex to Allegra, Claritin, or Zyrtec D, Flonase, saline nasal  irrigation. Discussed possibility of giving antibiotics for this, but we have decided to wait.  Giving primary care referral  Discussed labs, MDM, plan and followup with patient. Patient  agrees with plan.   Meds ordered this encounter  Medications  . cefTRIAXone (ROCEPHIN) injection 1 g  . nitrofurantoin, macrocrystal-monohydrate, (MACROBID) 100 MG capsule    Sig: Take 1 capsule (100 mg total) by mouth 2 (two) times daily. X 5 days    Dispense:  10 capsule    Refill:  0  . phenazopyridine (PYRIDIUM) 200 MG tablet    Sig: Take 1 tablet (200 mg total) by mouth 3 (three) times daily as needed for pain.    Dispense:  6 tablet    Refill:  0  . fluticasone (FLONASE) 50 MCG/ACT nasal spray    Sig: Place 2 sprays into both nostrils daily.    Dispense:  16 g    Refill:  0    *This clinic note was created using  Scientist, clinical (histocompatibility and immunogenetics). Therefore, there may be occasional mistakes despite careful proofreading.  ?   Domenick Gong, MD 05/20/17 1130

## 2017-05-22 LAB — URINE CULTURE
Culture: >=100000 — AB
Special Requests: NORMAL

## 2017-06-05 ENCOUNTER — Encounter (HOSPITAL_COMMUNITY): Payer: Self-pay | Admitting: Family Medicine

## 2017-06-05 ENCOUNTER — Ambulatory Visit (HOSPITAL_COMMUNITY)
Admission: EM | Admit: 2017-06-05 | Discharge: 2017-06-05 | Disposition: A | Discharge status: Home / Self Care | Payer: BLUE CROSS/BLUE SHIELD

## 2017-06-05 NOTE — ED Triage Notes (Signed)
Pt here for injury to right leg, mostly in the right knee. Bruising and swelling. Reports that she hit it on a metal piece connected to the dumpster.

## 2017-06-07 ENCOUNTER — Ambulatory Visit (HOSPITAL_COMMUNITY)
Admission: EM | Admit: 2017-06-07 | Discharge: 2017-06-07 | Disposition: A | Discharge status: Home / Self Care | Payer: BLUE CROSS/BLUE SHIELD | Attending: Internal Medicine | Admitting: Internal Medicine

## 2017-06-07 ENCOUNTER — Ambulatory Visit (INDEPENDENT_AMBULATORY_CARE_PROVIDER_SITE_OTHER): Payer: BLUE CROSS/BLUE SHIELD

## 2017-06-07 ENCOUNTER — Encounter (HOSPITAL_COMMUNITY): Payer: Self-pay | Admitting: *Deleted

## 2017-06-07 DIAGNOSIS — M25561 Pain in right knee: Secondary | ICD-10-CM | POA: Diagnosis not present

## 2017-06-07 MED ORDER — MELOXICAM 7.5 MG PO TABS: 7.5000 mg | ORAL_TABLET | Freq: Every day | ORAL | 0 refills | Status: AC

## 2017-06-07 NOTE — ED Notes (Signed)
Large amount ecchymosis and some swelling noted right knee.  States she accidentally hit knee on metal dumpster on 10/5.  Has been wearing knee sleeve.

## 2017-06-07 NOTE — Discharge Instructions (Signed)
Take mobic as directed. Do not take ibuprofen/naproxen while on mobic. Ice compress and elevation to help with swelling and inflammation. Wear knee sleeve during activity. Follow up with orthopedics if symptoms not improving.

## 2017-06-07 NOTE — ED Triage Notes (Signed)
Patient reports hitting right knee on the 5th, seen her for the same, and is continuing to have pain, swelling, and bruising.

## 2017-06-07 NOTE — ED Triage Notes (Signed)
Per record, pt LWBS on 10/5.

## 2017-06-07 NOTE — ED Provider Notes (Signed)
[facility]    CSN: [id number] Arrival date & time: 06/07/17  1255     History   Chief Complaint Chief Complaint  Patient presents with  . Knee Pain    HPI ADISEN BENNION is a 41 y.o. female.   41 year old female with history of hep c, polysubstance abuse, comes in for 2 day history of right knee pain after hitting her knee on metal while walking. She has bruising along the medial side of the knee and swelling on the lateral side of knee. She has been taking ibuprofen  3-4 times a day without relief. She got a knee sleeve, but has been wearing it on and off because she hasn't felt relief with it. Denies numbness/tingling. She is able to ambulate without problem but with slight limp when walking.       Past Medical History:  Diagnosis Date  . Hepatitis C     Patient Active Problem List   Diagnosis Date Noted  . Polysubstance abuse (HCC) 08/05/2016  . Cocaine use disorder, severe, dependence (HCC) 02/26/2016  . Methylenedioxymethyamphetamine (MDMA) use disorder, moderate (HCC) 02/26/2016  . 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 APPENDECTOMY    . CESAREAN SECTION  x2    OB History    No data available       Home Medications    Prior to Admission medications   Medication Sig Start Date End Date Taking? Authorizing Provider  buPROPion (WELLBUTRIN SR) 150 MG 12 hr tablet Take 150 mg by mouth 2 (two) times daily.   Yes [provider]  busPIRone (BUSPAR) 30 MG tablet Take 30 mg by mouth daily.   Yes [provider]  fluticasone (FLONASE) 50 MCG/ACT nasal spray Place 2 sprays into both nostrils daily. 05/20/17  Yes Domenick Gong, MD  prazosin (MINIPRESS) 2 MG capsule Take 4 mg by mouth at bedtime.   Yes [provider]  hydrOXYzine (ATARAX/VISTARIL) 25 MG tablet Take 1 tablet (25 mg total) by mouth every  6 (six) hours as needed for anxiety. 08/06/16   Laveda Abbe, NP  meloxicam (MOBIC) 7.5 MG tablet Take 1 tablet (7.5 mg total) by mouth daily. 06/07/17   Cathie Hoops, Colvin Blatt V, PA-C  nicotine (NICODERM CQ - DOSED IN MG/24 HOURS) 21 mg/24hr patch Place 1 patch (21 mg total) onto the skin daily. 08/06/16   Laveda Abbe, NP  nitrofurantoin, macrocrystal-monohydrate, (MACROBID) 100 MG capsule Take 1 capsule (100 mg total) by mouth 2 (two) times daily. X 5 days 05/20/17   Domenick Gong, MD  phenazopyridine (PYRIDIUM) 200 MG tablet Take 1 tablet (200 mg total) by mouth 3 (three) times daily as needed for pain. 05/20/17   Domenick Gong, MD  traZODone (DESYREL) 50 MG tablet Take 1 tablet (50 mg total) by mouth at bedtime as needed for sleep. 08/06/16   Laveda Abbe, NP    Family History Family History  Problem Relation Age of Onset  . Cancer Mother   . Alcoholism Mother   . Depression Mother     Social History Social History  Substance Use Topics  . Smoking status: Current Every Day Smoker    Packs/day: 1.50    Types: Cigarettes  . Smokeless tobacco: Never Used     Comment: Patient refused cessation materials  . Alcohol use No     Comment: Sts she drinks more than 6 drinks  daily     Allergies   Patient has no known allergies.   Review of Systems Review of Systems  Reason unable to perform ROS: See HPI as above.     Physical Exam Triage Vital Signs ED Triage Vitals  Enc Vitals Group     BP 06/07/17 1407 117/78     Pulse Rate 06/07/17 1407 65     Resp 06/07/17 1407 16     Temp 06/07/17 1407 97.9 F (36.6 C)     Temp Source 06/07/17 1407 Oral     SpO2 06/07/17 1407 100 %     Weight --      Height --      Head Circumference --      Peak Flow --      Pain Score 06/07/17 1404 9     Pain Loc --      Pain Edu? --      Excl. in GC? --    No data found.   Updated Vital Signs BP 117/78 (BP Location: Left Arm)   Pulse 65   Temp 97.9 F (36.6 C) (Oral)    Resp 16   LMP 05/22/2017   SpO2 100%   Physical Exam  Constitutional: She is oriented to person, place, and time. She appears well-developed and well-nourished. No distress.  HENT:  Head: Normocephalic and atraumatic.  Eyes: Pupils are equal, round, and reactive to light. Conjunctivae are normal.  Musculoskeletal:  Bruising along medial side of thigh and knee. Swelling around lateral upper quadrant of knee. No erythema, increased warmth. Tenderness on palpation around swollen area. Full ROM of knee. Strength normal and equal bilaterally. Negative posterior/anterior drawer. No laxity/crepitus with valgus/varus stress test. Sensation intact.   Neurological: She is alert and oriented to person, place, and time.     UC Treatments / Results  Labs (all labs ordered are listed, but only abnormal results are displayed) Labs Reviewed - No data to display  EKG  EKG Interpretation None       Radiology Dg Knee Complete 4 Views Right  Result Date: 06/07/2017 CLINICAL DATA:  Right knee pain after blunt trauma. EXAM: RIGHT KNEE - COMPLETE 4+ VIEW COMPARISON:  None. FINDINGS: No evidence of fracture, dislocation, or joint effusion. No evidence of arthropathy or other focal bone abnormality. Soft tissues are unremarkable. IMPRESSION: Negative. Electronically Signed   By: Obie Dredge M.D.   On: 06/07/2017 14:32    Procedures Procedures (including critical care time)  Medications Ordered in UC Medications - No data to display   Initial Impression / Assessment and Plan / UC Course  I have reviewed the triage vital signs and the nursing notes.  Pertinent labs & imaging results that were available during my care of the patient were reviewed by me and considered in my medical decision making (see chart for details).    Xray negative for fracture/dislocation/joint effusion. Mobic as directed. Ice compress and elevation. Knee sleeve during activity. Follow up with orthopedics if symptoms not  improving/worsens.   Final Clinical Impressions(s) / UC Diagnoses   Final diagnoses:  Acute pain of right knee    New Prescriptions New Prescriptions   MELOXICAM (MOBIC) 7.5 MG TABLET    Take 1 tablet (7.5 mg total) by mouth daily.      Belinda Fisher, PA-C 06/07/17 1510

## 2019-05-13 IMAGING — DX DG KNEE COMPLETE 4+V*R*
4 series · 4 of 4 positions shown · non-contrast
Comparison: None.

CLINICAL DATA: Right knee pain after blunt trauma.

EXAM:
RIGHT KNEE - COMPLETE 4+ VIEW

[knee ap]
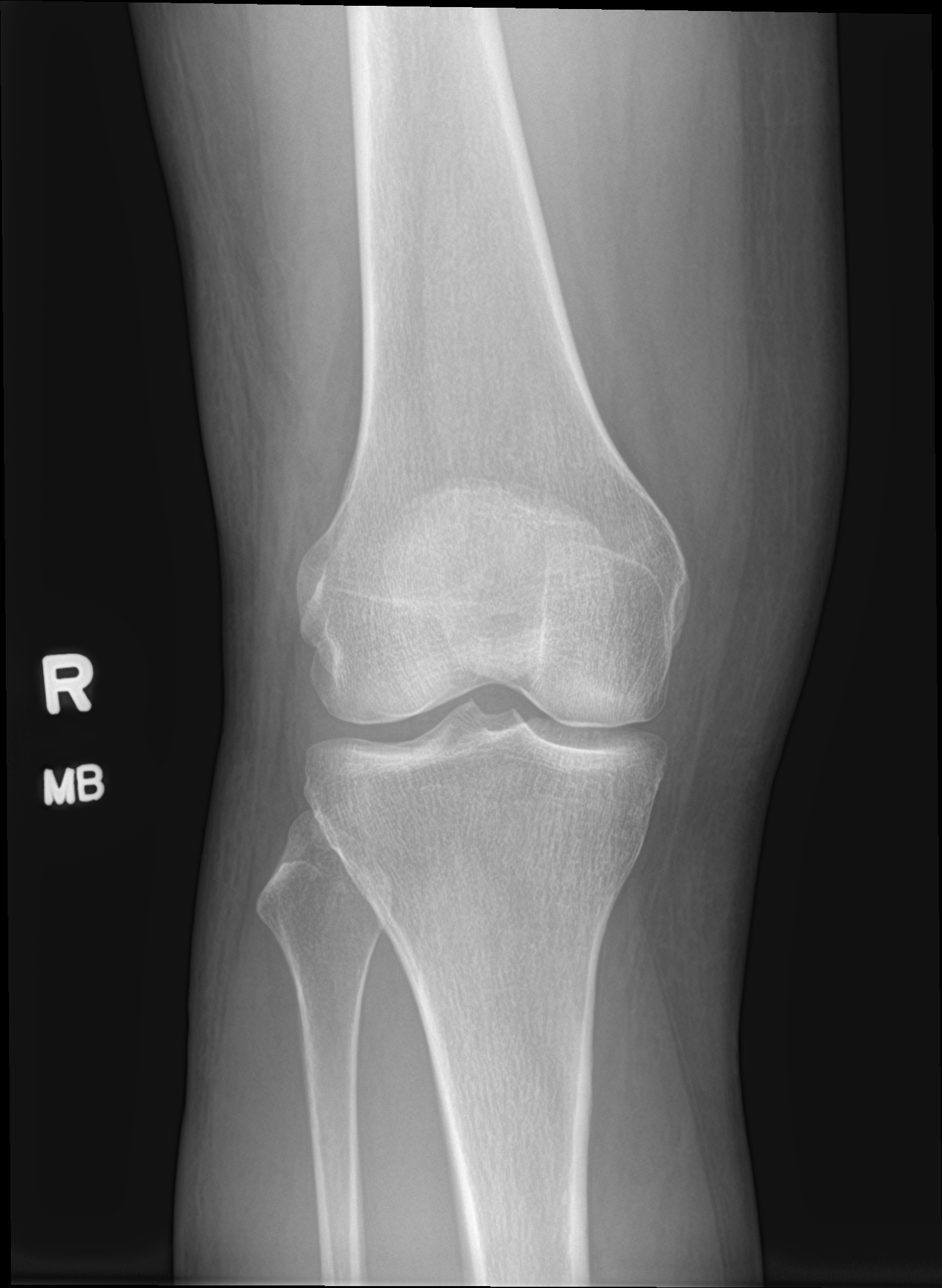

[knee obl (1 of 2)]
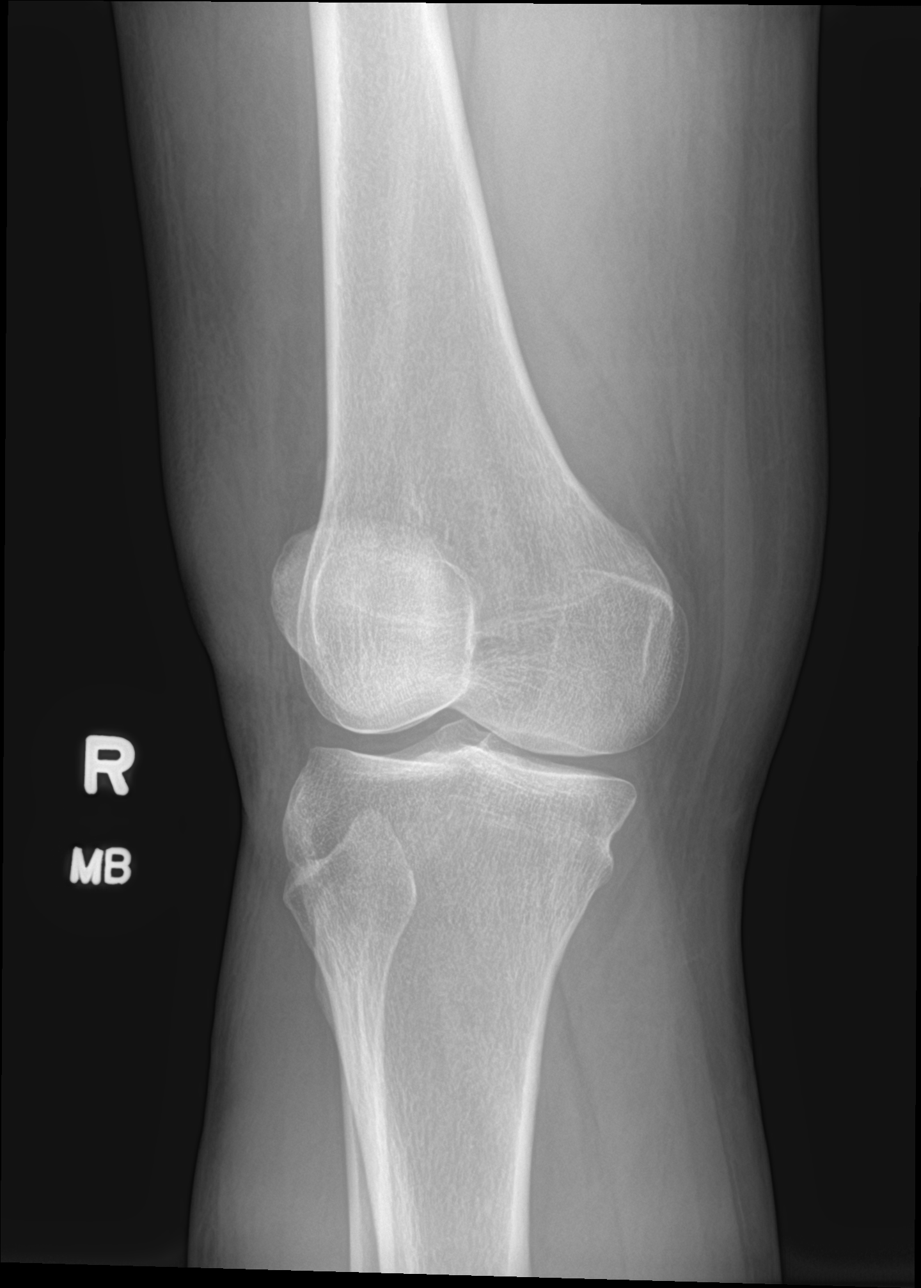

[knee obl (2 of 2)]
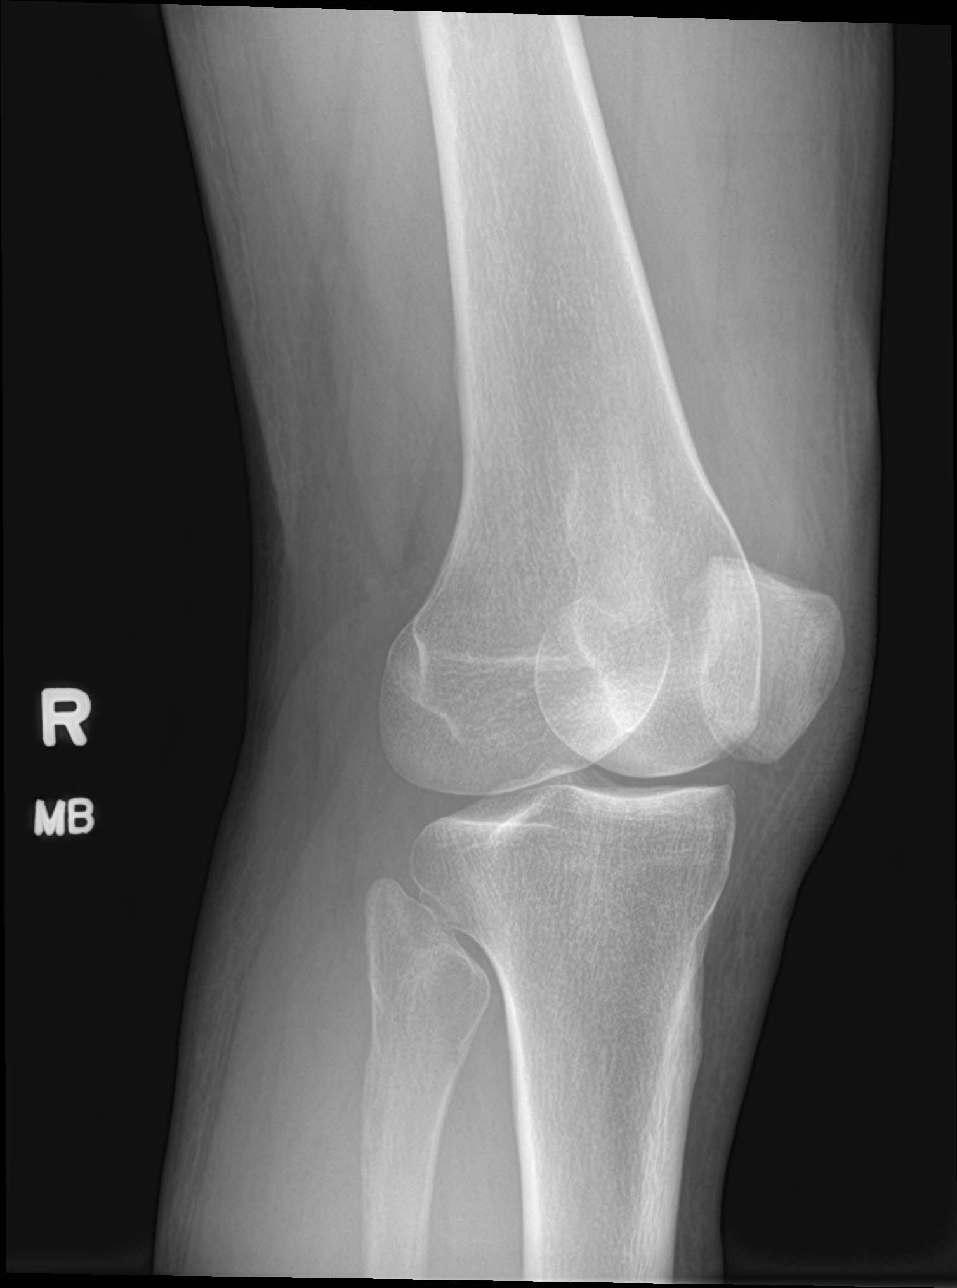

[knee lat]
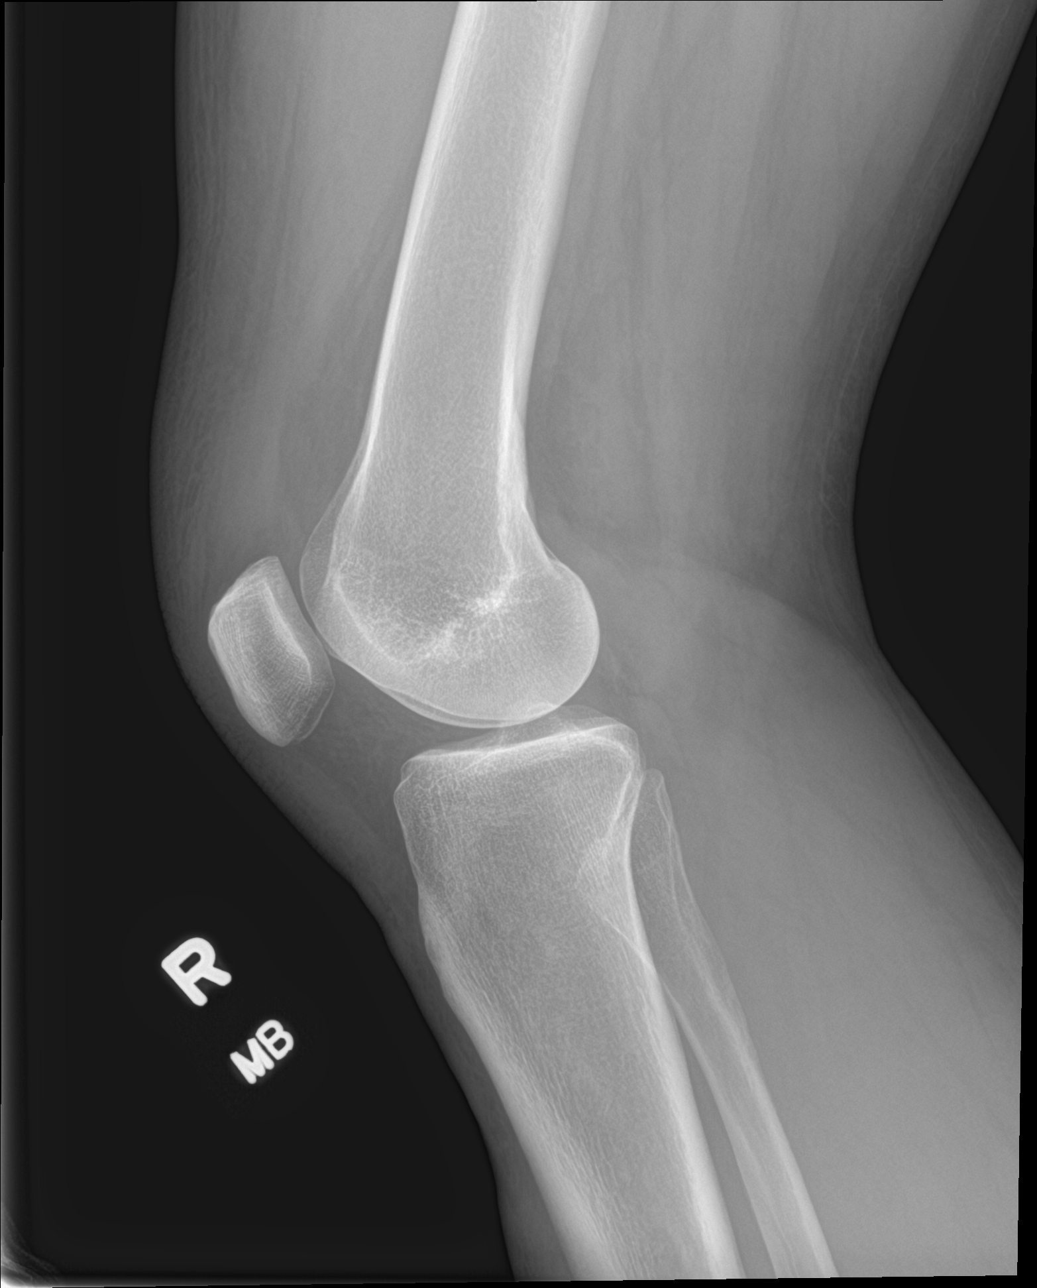

[4 of 4 positions shown; findings below may reference images not displayed]

FINDINGS: No evidence of fracture, dislocation, or joint effusion. No evidence
of arthropathy or other focal bone abnormality. Soft tissues are
unremarkable.
IMPRESSION: Negative.
